# Patient Record
Sex: Female | Born: 1935 | Race: White | Hispanic: No | Marital: Married | Smoking: Never smoker
Health system: Southern US, Community
[De-identification: ages and names within clinical notes are randomized; demographics above are authoritative.]

## PROBLEM LIST (undated history)

## (undated) DIAGNOSIS — M5126 Other intervertebral disc displacement, lumbar region: Secondary | ICD-10-CM

## (undated) DIAGNOSIS — E559 Vitamin D deficiency, unspecified: Secondary | ICD-10-CM

## (undated) DIAGNOSIS — Z780 Asymptomatic menopausal state: Secondary | ICD-10-CM

## (undated) DIAGNOSIS — K5790 Diverticulosis of intestine, part unspecified, without perforation or abscess without bleeding: Secondary | ICD-10-CM

## (undated) DIAGNOSIS — N390 Urinary tract infection, site not specified: Secondary | ICD-10-CM

## (undated) DIAGNOSIS — M1990 Unspecified osteoarthritis, unspecified site: Secondary | ICD-10-CM

## (undated) DIAGNOSIS — N3941 Urge incontinence: Secondary | ICD-10-CM

## (undated) DIAGNOSIS — I10 Essential (primary) hypertension: Secondary | ICD-10-CM

## (undated) DIAGNOSIS — C4490 Unspecified malignant neoplasm of skin, unspecified: Secondary | ICD-10-CM

## (undated) DIAGNOSIS — M810 Age-related osteoporosis without current pathological fracture: Secondary | ICD-10-CM

## (undated) DIAGNOSIS — K219 Gastro-esophageal reflux disease without esophagitis: Secondary | ICD-10-CM

## (undated) DIAGNOSIS — E785 Hyperlipidemia, unspecified: Secondary | ICD-10-CM

## (undated) HISTORY — DX: Unspecified osteoarthritis, unspecified site: M19.90

## (undated) HISTORY — DX: Asymptomatic menopausal state: Z78.0

## (undated) HISTORY — PX: CATARACT EXTRACTION: SUR2

## (undated) HISTORY — DX: Hyperlipidemia, unspecified: E78.5

## (undated) HISTORY — DX: Other intervertebral disc displacement, lumbar region: M51.26

## (undated) HISTORY — DX: Diverticulosis of intestine, part unspecified, without perforation or abscess without bleeding: K57.90

## (undated) HISTORY — DX: Urge incontinence: N39.41

## (undated) HISTORY — DX: Unspecified malignant neoplasm of skin, unspecified: C44.90

---
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
## 1969-03-27 HISTORY — PX: INGUINAL HERNIA REPAIR: SUR1180

## 1983-03-28 HISTORY — PX: ABDOMINAL HYSTERECTOMY: SHX81

## 2003-03-28 HISTORY — PX: FIXATION KYPHOPLASTY LUMBAR SPINE: SHX1642

## 2005-04-13 LAB — HM COLONOSCOPY: HM Colonoscopy: NORMAL

## 2009-04-13 LAB — HM DEXA SCAN: HM Dexa Scan: NORMAL

## 2010-12-22 ENCOUNTER — Encounter: Payer: Self-pay | Admitting: Internal Medicine

## 2010-12-22 ENCOUNTER — Ambulatory Visit (INDEPENDENT_AMBULATORY_CARE_PROVIDER_SITE_OTHER): Payer: Medicare Other | Admitting: Internal Medicine

## 2010-12-22 ENCOUNTER — Ambulatory Visit (HOSPITAL_BASED_OUTPATIENT_CLINIC_OR_DEPARTMENT_OTHER)
Admission: RE | Admit: 2010-12-22 | Discharge: 2010-12-22 | Disposition: A | Discharge status: Home / Self Care | Payer: Medicare Other | Source: Ambulatory Visit | Attending: Internal Medicine | Admitting: Internal Medicine

## 2010-12-22 VITALS — BP 110/68 | HR 69 | Temp 97.1°F | Resp 20 | Ht 65.5 in | Wt 164.0 lb

## 2010-12-22 DIAGNOSIS — M1990 Unspecified osteoarthritis, unspecified site: Secondary | ICD-10-CM | POA: Insufficient documentation

## 2010-12-22 DIAGNOSIS — K5730 Diverticulosis of large intestine without perforation or abscess without bleeding: Secondary | ICD-10-CM

## 2010-12-22 DIAGNOSIS — N3941 Urge incontinence: Secondary | ICD-10-CM | POA: Insufficient documentation

## 2010-12-22 DIAGNOSIS — M5126 Other intervertebral disc displacement, lumbar region: Secondary | ICD-10-CM | POA: Insufficient documentation

## 2010-12-22 DIAGNOSIS — E785 Hyperlipidemia, unspecified: Secondary | ICD-10-CM

## 2010-12-22 DIAGNOSIS — K5790 Diverticulosis of intestine, part unspecified, without perforation or abscess without bleeding: Secondary | ICD-10-CM

## 2010-12-22 DIAGNOSIS — L989 Disorder of the skin and subcutaneous tissue, unspecified: Secondary | ICD-10-CM

## 2010-12-22 DIAGNOSIS — Z139 Encounter for screening, unspecified: Secondary | ICD-10-CM

## 2010-12-22 DIAGNOSIS — M7730 Calcaneal spur, unspecified foot: Secondary | ICD-10-CM

## 2010-12-22 DIAGNOSIS — Z780 Asymptomatic menopausal state: Secondary | ICD-10-CM | POA: Insufficient documentation

## 2010-12-22 DIAGNOSIS — Z1231 Encounter for screening mammogram for malignant neoplasm of breast: Secondary | ICD-10-CM | POA: Insufficient documentation

## 2010-12-22 DIAGNOSIS — R32 Unspecified urinary incontinence: Secondary | ICD-10-CM

## 2010-12-22 DIAGNOSIS — C4490 Unspecified malignant neoplasm of skin, unspecified: Secondary | ICD-10-CM | POA: Insufficient documentation

## 2010-12-22 DIAGNOSIS — Z85828 Personal history of other malignant neoplasm of skin: Secondary | ICD-10-CM

## 2010-12-22 LAB — TSH: TSH: 2.335 u[IU]/mL (ref 0.350–4.500)

## 2010-12-22 LAB — COMPREHENSIVE METABOLIC PANEL
Albumin: 4.1 g/dL (ref 3.5–5.2)
BUN: 22 mg/dL (ref 6–23)
CO2: 28 mEq/L (ref 19–32)
Calcium: 9.9 mg/dL (ref 8.4–10.5)
Chloride: 105 mEq/L (ref 96–112)
Glucose, Bld: 82 mg/dL (ref 70–99)
Potassium: 4.5 mEq/L (ref 3.5–5.3)
Total Protein: 6.9 g/dL (ref 6.0–8.3)

## 2010-12-22 LAB — CBC WITH DIFFERENTIAL/PLATELET
Basophils Relative: 1 % (ref 0–1)
HCT: 42.6 % (ref 36.0–46.0)
Hemoglobin: 13.6 g/dL (ref 12.0–15.0)
Lymphocytes Relative: 27 % (ref 12–46)
Lymphs Abs: 1.7 10*3/uL (ref 0.7–4.0)
MCHC: 31.9 g/dL (ref 30.0–36.0)
Monocytes Absolute: 0.5 10*3/uL (ref 0.1–1.0)
Monocytes Relative: 7 % (ref 3–12)
Neutro Abs: 3.7 10*3/uL (ref 1.7–7.7)
RBC: 4.59 MIL/uL (ref 3.87–5.11)

## 2010-12-22 LAB — LIPID PANEL: LDL Cholesterol: 148 mg/dL — ABNORMAL HIGH (ref 0–99)

## 2010-12-22 NOTE — Progress Notes (Signed)
Subjective:    Patient ID: Brianna Gregory, female    DOB: June 11, 1926, 77 y.o.   MRN: [id number]  HPI New pt here for first visit.  Resides in [place] with husband  Daughter Royal Piedra [phone].  Rolland Bimler [phone].  PMH of DJD, post-menopause, skin CA (believes it may be melanoma), urinary incontinence, Hyperlipidemia, diverticulosis heel spur, and chronic back pain secondary to HNP, DJD, and trauma from fall on black ice in 2005.  She is S/P baloon kyphoplasty.  She uses a TENS unit as needed, uses Celebrex and Tramadol prn and when in severe pain, prednisone is helpful.  She is active and does water aerobics 3 times per week.  Has also had steroid injections in lumbar area.   She is concerned over two issues.   She has not had her cholesterol checked in quite some time and it has been slightly elevated in the past.  She also has several scaly lesions on nose and neck that she would like to see a dermatologist about as she has had skin cancer in the past.  [place]   When asked about her heel spur she states that "we don't need to deal with that as yet."  Last colonoscopy 2007 no polyps per her report  No Known Allergies Past Medical History  Diagnosis Date  . Menopause   . Lumbar herniated disc   . Arthritis   . Skin cancer   . Glaucoma   . Urge incontinence   . Diverticulosis     colonoscopy 2007   Past Surgical History  Procedure Date  . Abdominal hysterectomy   . Fixation kyphoplasty lumbar spine 2005  . Hernia repair   . Appendectomy   . Cataract extraction    History   Social History  . Marital Status: Married    Spouse Name: N/A    Number of Children: N/A  . Years of Education: N/A   Occupational History  . Not on file.   Social History Main Topics  . Smoking status: Never Smoker   . Smokeless tobacco: Never Used  . Alcohol Use: 1.2 oz/week    2 Glasses of wine per week  . Drug  Use: No  . Sexually Active: Not Currently   Other Topics Concern  . Not on file   Social History Narrative  . No narrative on file   Family History  Problem Relation Age of Onset  . Heart failure Mother   . Diabetes Mother   . Heart failure Father   . Diabetes Father    Patient Active Problem List  Diagnoses  . Arthritis  . Skin cancer  . Menopause  . Glaucoma  . Lumbar herniated disc  . Urge incontinence  . Diverticulosis  . Heel spur   No current outpatient prescriptions on file prior to visit.        Review of Systems    No chest pain, no Sob, No LE edema.  No change in color of stool Objective:   Physical Exam Physical Exam  Nursing note and vitals reviewed.  Constitutional: She is oriented to person, place, and time. She appears well-developed and well-nourished.  HENT:  Head: Normocephalic and atraumatic.  Cardiovascular: Normal rate and regular rhythm. Exam reveals no gallop and no friction rub.  No murmur heard.  Pulmonary/Chest: Breath sounds normal. She has no wheezes. She has no rales.  Neurological: She is alert and oriented  to person, place, and time.  Skin: Skin is warm and dry. She has one skin tag on her neck and a few scaly lesions on nose and neck Psychiatric: She has a normal mood and affect. Her behavior is normal.      Assessment & Plan:  1)  Hyperlipidemia :  Will check lipids today with chenistries, CBC, and TSH 2)  Skin lesion h/o skin CA  Will refer to Dr. Emily Filbert 3)  Chronic back discomfort,  DJD on Celebrex and prn Tramadol 4) Urge incontinence 5) Diverticulosis 6)  Heel spur  Will address next visit 7)  Glaucoma  Will schedule CPE  Get mammogram today

## 2010-12-22 NOTE — Patient Instructions (Signed)
Schedule Complete physical exam with me with me  Will mail labs to you  We will set up referral to Dermatology  Mammogram today

## 2010-12-23 NOTE — Progress Notes (Signed)
Pt is scheduled for appointment with Dr. Emily Filbert 01/18/11 @ 210pm.  She is aware of appointment per K. Harvell-dhp, rn

## 2010-12-26 ENCOUNTER — Encounter: Payer: Self-pay | Admitting: Emergency Medicine

## 2011-01-12 ENCOUNTER — Encounter: Payer: Self-pay | Admitting: Internal Medicine

## 2011-01-12 ENCOUNTER — Ambulatory Visit (INDEPENDENT_AMBULATORY_CARE_PROVIDER_SITE_OTHER): Payer: Medicare Other | Admitting: Internal Medicine

## 2011-01-12 VITALS — BP 130/86 | HR 76 | Temp 98.5°F | Resp 16 | Ht 65.5 in | Wt 163.0 lb

## 2011-01-12 DIAGNOSIS — M129 Arthropathy, unspecified: Secondary | ICD-10-CM

## 2011-01-12 DIAGNOSIS — N3941 Urge incontinence: Secondary | ICD-10-CM

## 2011-01-12 DIAGNOSIS — C4490 Unspecified malignant neoplasm of skin, unspecified: Secondary | ICD-10-CM

## 2011-01-12 DIAGNOSIS — M1990 Unspecified osteoarthritis, unspecified site: Secondary | ICD-10-CM

## 2011-01-12 DIAGNOSIS — Z01419 Encounter for gynecological examination (general) (routine) without abnormal findings: Secondary | ICD-10-CM

## 2011-01-12 DIAGNOSIS — K621 Rectal polyp: Secondary | ICD-10-CM | POA: Insufficient documentation

## 2011-01-12 DIAGNOSIS — K5790 Diverticulosis of intestine, part unspecified, without perforation or abscess without bleeding: Secondary | ICD-10-CM

## 2011-01-12 DIAGNOSIS — K5730 Diverticulosis of large intestine without perforation or abscess without bleeding: Secondary | ICD-10-CM

## 2011-01-12 LAB — POCT URINALYSIS DIPSTICK
Ketones, UA: NEGATIVE
Leukocytes, UA: NEGATIVE
Nitrite, UA: NEGATIVE
Protein, UA: NEGATIVE
pH, UA: 7

## 2011-01-12 NOTE — Progress Notes (Addendum)
Subjective:    Patient ID: Brianna Gregory, female    DOB: 30-Sep-1926, 77 y.o.   MRN: [id number]  HPI  Sage is her for comprehensive evaluation.  She is doing well and will be getting her flu vaccine [facility].  She brings copy of colonoscopy report which showed rectal polyp done in 2007.  Arthritis  She has very little problem with her joints except R thumb will bother her.  She does knit .  She is quite active and does water aerobics.  Pt describes that she slipped in a step stool and fell hitting the back of her head 4 days ago.  No LOC.  Was seen by nurse at [facility].  No swelling of scalp now.  No pain in back or hips.  No dizziness, chest pain, or palpitaitons prior to fall  No Known Allergies Past Medical History  Diagnosis Date  . Menopause   . Lumbar herniated disc   . Arthritis   . Skin cancer   . Glaucoma   . Urge incontinence   . Diverticulosis     colonoscopy 2007   Past Surgical History  Procedure Date  . Abdominal hysterectomy   . Fixation kyphoplasty lumbar spine 2005  . Hernia repair   . Appendectomy   . Cataract extraction    History   Social History  . Marital Status: Married    Spouse Name: N/A    Number of Children: N/A  . Years of Education: N/A   Occupational History  . Not on file.   Social History Main Topics  . Smoking status: Never Smoker   . Smokeless tobacco: Never Used  . Alcohol Use: 1.2 oz/week    2 Glasses of wine per week  . Drug Use: No  . Sexually Active: Not Currently   Other Topics Concern  . Not on file   Social History Narrative  . No narrative on file   Family History  Problem Relation Age of Onset  . Heart failure Mother   . Diabetes Mother   . Heart failure Father   . Diabetes Father    Patient Active Problem List  Diagnoses  . Arthritis  . Skin cancer  . Menopause  . Glaucoma  . Lumbar herniated disc  . Urge incontinence  . Diverticulosis  . Heel spur  . Rectal polyp   Current  Outpatient Prescriptions on File Prior to Visit  Medication Sig Dispense Refill  . Carboxymethylcell-Hypromellose (GENTEAL) 0.25-0.3 % GEL Apply to eye at bedtime.        . celecoxib (CELEBREX) 200 MG capsule Take 200 mg by mouth 2 (two) times daily.        . Hypromellose (GENTEAL) 0.3 % SOLN Apply 1 drop to eye 4 (four) times daily.        . Multiple Vitamins-Minerals (CENTRUM SILVER PO) Take 1 tablet by mouth daily.        . Omega-3 Fatty Acids (FISH OIL) 1200 MG CAPS Take 1 capsule by mouth daily.        . polyethylene glycol powder (GLYCOLAX/MIRALAX) powder Take 17 g by mouth daily as needed.        . timolol (BETIMOL) 0.5 % ophthalmic solution Place 1 drop into both eyes daily.        . traMADol (ULTRAM) 50 MG tablet Take 50 mg by mouth every 8 (eight) hours as needed.               Review of Systems  No chest pain no sob no Le edema No headached no visual changes No change in color of stool    Objective:   Physical Exam Physical Exam  Nursing note and vitals reviewed.  Constitutional: She is oriented to person, place, and time. She appears well-developed and well-nourished.  HENT:  Head: Normocephalic and atraumatic.  Right Ear: Tympanic membrane and ear canal normal. No drainage. Tympanic membrane is not injected and not erythematous.  Left Ear: Tympanic membrane and ear canal normal. No drainage. Tympanic membrane is not injected and not erythematous.  Nose: Nose normal. Right sinus exhibits no maxillary sinus tenderness and no frontal sinus tenderness. Left sinus exhibits no maxillary sinus tenderness and no frontal sinus tenderness.  Mouth/Throat: Oropharynx is clear and moist. No oral lesions. No oropharyngeal exudate.  Eyes: Conjunctivae and EOM are normal. Pupils are equal, round, and reactive to light.  Neck: Normal range of motion. Neck supple. No JVD present. Carotid bruit is not present. No mass and no thyromegaly present.  Cardiovascular: Normal rate, regular  rhythm, S1 normal, S2 normal and intact distal pulses. Exam reveals no gallop and no friction rub.  No murmur heard.  Pulses:  Carotid pulses are 2+ on the right side, and 2+ on the left side.  Dorsalis pedis pulses are 2+ on the right side, and 2+ on the left side.  No carotid bruit. No LE edema  Pulmonary/Chest: Breath sounds normal. She has no wheezes. She has no rales. She exhibits no tenderness. Breasts no discrete masses no nipple discharge no axillary adenopathy bilaterally. Abdominal: Soft. Bowel sounds are normal. She exhibits no distension and no mass. There is no hepatosplenomegaly. There is no tenderness. There is no CVA tenderness.  Musculoskeletal: Normal range of motion.  No active synovitis to joints.  Lymphadenopathy:  She has no cervical adenopathy.  She has no axillary adenopathy.  Right: No inguinal and no supraclavicular adenopathy present.  Left: No inguinal and no supraclavicular adenopathy present.  Neurological: She is alert and oriented to person, place, and time. She has normal strength and normal reflexes. She displays no tremor. No cranial nerve deficit or sensory deficit. Coordination and gait normal.  Skin: Skin is warm and dry. No rash noted. No cyanosis. Nails show no clubbing.  Psychiatric: She has a normal mood and affect. Her speech is normal and behavior is normal. Cognition and memory are normal.           Assessment & Plan:  1)  Rectal polyp  Will set up referral to dr. Lina Sar.  Pt requests referral to be in 2013 2) DJD  On Celebrex 3)_ Diverticulosis 4)  Urge incontinence  Does to wish any meds at this point 5)  Fall 4 days ago.  Seem to be resolving nicely  I spent 45 minutes with the pt  Addendum:  Head CT verbal preliminary report 02/01/13  No acute intracranial process,  Atrophy and microvascular chronic ischemic changes

## 2011-01-12 NOTE — Patient Instructions (Signed)
Will set up referral to Dr. Juanda Chance  Return prn

## 2011-01-16 ENCOUNTER — Telehealth: Payer: Self-pay | Admitting: Internal Medicine

## 2011-01-16 NOTE — Telephone Encounter (Signed)
Per Selena Batten on 01/16/2011: Pt called in on Friday and stated she misplaced her diet and copy of labs and would like them mail out to her. Thanks

## 2011-01-17 NOTE — Telephone Encounter (Signed)
Copy of letter and DASH diet mailed to pt's home address

## 2011-01-23 ENCOUNTER — Ambulatory Visit (INDEPENDENT_AMBULATORY_CARE_PROVIDER_SITE_OTHER): Payer: Medicare Other | Admitting: Emergency Medicine

## 2011-01-23 VITALS — BP 148/84 | HR 70

## 2011-01-23 DIAGNOSIS — Z23 Encounter for immunization: Secondary | ICD-10-CM

## 2011-01-23 MED ORDER — TETANUS-DIPHTH-ACELL PERTUSSIS 5-2.5-18.5 LF-MCG/0.5 IM SUSP: 0.5000 mL | Freq: Once | INTRAMUSCULAR | Status: DC

## 2011-02-01 ENCOUNTER — Encounter: Payer: Self-pay | Admitting: Internal Medicine

## 2011-02-01 ENCOUNTER — Ambulatory Visit (INDEPENDENT_AMBULATORY_CARE_PROVIDER_SITE_OTHER): Payer: Medicare Other | Admitting: Internal Medicine

## 2011-02-01 DIAGNOSIS — R51 Headache: Secondary | ICD-10-CM

## 2011-02-01 DIAGNOSIS — S0990XA Unspecified injury of head, initial encounter: Secondary | ICD-10-CM

## 2011-02-01 DIAGNOSIS — R519 Headache, unspecified: Secondary | ICD-10-CM | POA: Insufficient documentation

## 2011-02-01 NOTE — Progress Notes (Signed)
Subjective:    Patient ID: Brianna Gregory, female    DOB: 1926/10/23, 77 y.o.   MRN: [id number]  HPI  Camille comes with an acute concern.  She has been having daily early am headaches for the past 2 weeks.  She is concerned because she fell and hit the back of her head on a table on 10/14.  She was checked by the staff at [facility] and they felt she was OK at the time  .  Pt reported on written document that "I feel fine".  She reports she did not lose consiousness.  She has no current visual changes no blurring no diplopia, no dysarthria, no muscle weakness or numbness in face or extremities.  She had recent eye exam and vision was reported as OK.  She takes Tramadol  Daily for joint pain and reports the Tramadol will relieve the pain.  She only has pain on awakening  Located in occipital areas  Allergies  Allergen Reactions  . Statins Other (See Comments)    myalgias   Past Medical History  Diagnosis Date  . Menopause   . Lumbar herniated disc   . Arthritis   . Skin cancer   . Glaucoma   . Urge incontinence   . Diverticulosis     colonoscopy 2007   Past Surgical History  Procedure Date  . Abdominal hysterectomy   . Fixation kyphoplasty lumbar spine 2005  . Hernia repair   . Appendectomy   . Cataract extraction    History   Social History  . Marital Status: Married    Spouse Name: N/A    Number of Children: N/A  . Years of Education: N/A   Occupational History  . Not on file.   Social History Main Topics  . Smoking status: Never Smoker   . Smokeless tobacco: Never Used  . Alcohol Use: 1.2 oz/week    2 Glasses of wine per week  . Drug Use: No  . Sexually Active: Not Currently   Other Topics Concern  . Not on file   Social History Narrative  . No narrative on file   Family History  Problem Relation Age of Onset  . Heart failure Mother   . Diabetes Mother   . Heart failure Father   . Diabetes Father    Patient Active Problem List  Diagnoses  .  Arthritis  . Skin cancer  . Menopause  . Glaucoma  . Lumbar herniated disc  . Urge incontinence  . Diverticulosis  . Heel spur  . Rectal polyp  . Headache   Current Outpatient Prescriptions on File Prior to Visit  Medication Sig Dispense Refill  . Carboxymethylcell-Hypromellose (GENTEAL) 0.25-0.3 % GEL Apply to eye at bedtime.        . celecoxib (CELEBREX) 200 MG capsule Take 200 mg by mouth 2 (two) times daily.        . Hypromellose (GENTEAL) 0.3 % SOLN Apply 1 drop to eye 4 (four) times daily.        . Multiple Vitamins-Minerals (CENTRUM SILVER PO) Take 1 tablet by mouth daily.        . Omega-3 Fatty Acids (FISH OIL) 1200 MG CAPS Take 1 capsule by mouth daily.        . polyethylene glycol powder (GLYCOLAX/MIRALAX) powder Take 17 g by mouth daily as needed.        . timolol (BETIMOL) 0.5 % ophthalmic solution Place 1 drop into both eyes daily.        Marland Kitchen  traMADol (ULTRAM) 50 MG tablet Take 50 mg by mouth every 8 (eight) hours as needed.         Current Facility-Administered Medications on File Prior to Visit  Medication Dose Route Frequency Provider Last Rate Last Dose  . DISCONTD: TDaP (BOOSTRIX) injection 0.5 mL  0.5 mL Intramuscular Once Levon Hedger, MD            Review of Systems    see HPI Objective:   Physical Exam Physical Exam  Nursing note and vitals reviewed.  Constitutional: She is oriented to person, place, and time. She appears well-developed and well-nourished.  HENT: Scalp  No nodules or ecchymosis Head: Normocephalic and atraumatic.  Perla  Fundi:  Unable to visualize due to pupil size.  No racoon's eyes.  Ears:  No hemotympanum Cardiovascular: Normal rate and regular rhythm. Exam reveals no gallop and no friction rub.  No murmur heard.  Pulmonary/Chest: Breath sounds normal. She has no wheezes. She has no rales.  Neurological: She is alert and oriented to person, place, and time.  Skin: Skin is warm and dry.  Psychiatric: She has a normal mood and  affect. Her behavior is normal.  Neurologic:  CN II-XII intact.  Cerebellar  Intact FTN  Motor 5/5 UE and LE  Sensory intact to pp.  No focal deficits        Assessment & Plan:  1)  Headache in setting of head injury 3 weeks ago:  Will get Head CT with and w/o.  Needs stat CBC and chemistry today.  OK to use Tramadol for now

## 2011-02-01 NOTE — Patient Instructions (Signed)
To Xray in am after bun/creatinine results known  Ok to take Tramadol for pain

## 2011-02-02 ENCOUNTER — Ambulatory Visit (HOSPITAL_BASED_OUTPATIENT_CLINIC_OR_DEPARTMENT_OTHER)
Admission: RE | Admit: 2011-02-02 | Discharge: 2011-02-02 | Disposition: A | Discharge status: Home / Self Care | Payer: Medicare Other | Source: Ambulatory Visit | Attending: Internal Medicine | Admitting: Internal Medicine

## 2011-02-02 DIAGNOSIS — R51 Headache: Secondary | ICD-10-CM | POA: Insufficient documentation

## 2011-02-02 DIAGNOSIS — S0990XA Unspecified injury of head, initial encounter: Secondary | ICD-10-CM

## 2011-02-02 DIAGNOSIS — X58XXXA Exposure to other specified factors, initial encounter: Secondary | ICD-10-CM

## 2011-02-02 DIAGNOSIS — G319 Degenerative disease of nervous system, unspecified: Secondary | ICD-10-CM

## 2011-02-02 LAB — COMPREHENSIVE METABOLIC PANEL
AST: 21 U/L (ref 0–37)
Alkaline Phosphatase: 67 U/L (ref 39–117)
BUN: 23 mg/dL (ref 6–23)
Creat: 0.88 mg/dL (ref 0.50–1.10)
Potassium: 4.4 mEq/L (ref 3.5–5.3)

## 2011-02-02 LAB — CBC WITH DIFFERENTIAL/PLATELET
Basophils Absolute: 0 10*3/uL (ref 0.0–0.1)
Basophils Relative: 1 % (ref 0–1)
Eosinophils Relative: 5 % (ref 0–5)
HCT: 38.3 % (ref 36.0–46.0)
MCHC: 31.9 g/dL (ref 30.0–36.0)
Monocytes Absolute: 0.4 10*3/uL (ref 0.1–1.0)
Neutro Abs: 3.8 10*3/uL (ref 1.7–7.7)
Platelets: 219 10*3/uL (ref 150–400)
RDW: 14.3 % (ref 11.5–15.5)

## 2011-02-02 MED ORDER — IOHEXOL 300 MG/ML  SOLN
80.0000 mL | Freq: Once | INTRAMUSCULAR | Status: AC | PRN
  Administered 2011-02-02: 80 mL via INTRAVENOUS

## 2011-02-03 ENCOUNTER — Encounter: Payer: Self-pay | Admitting: Emergency Medicine

## 2011-02-15 ENCOUNTER — Encounter: Payer: Self-pay | Admitting: Internal Medicine

## 2011-03-10 ENCOUNTER — Encounter: Payer: Self-pay | Admitting: Internal Medicine

## 2011-03-10 ENCOUNTER — Telehealth: Payer: Self-pay | Admitting: Emergency Medicine

## 2011-03-10 NOTE — Telephone Encounter (Signed)
Spoke with Brianna Gregory.  She states that she is having skin level stinging on her lower back "at L1" where she had a "balloon procedure" years ago.  She states that the stinging has been going on for a few weeks now.  She noticed a knot last night when she was applying lotion.  This morning, the knot has gone away.  She denies any frank back pain, radicular symptoms.  She did have a fall in Oct (see OV note from Nov) without injury, but does remember falling on a step stool with one of the bars across her back where the stinging is now.  I scheduled an appt with Dr. Adrian Blackwater on Tuesday, advised her if symptoms worsen to seek care at urgent care or ED.  She is agreeable

## 2011-03-14 ENCOUNTER — Ambulatory Visit (INDEPENDENT_AMBULATORY_CARE_PROVIDER_SITE_OTHER): Payer: Medicare Other | Admitting: Internal Medicine

## 2011-03-14 ENCOUNTER — Encounter: Payer: Self-pay | Admitting: Internal Medicine

## 2011-03-14 DIAGNOSIS — Z8781 Personal history of (healed) traumatic fracture: Secondary | ICD-10-CM

## 2011-03-14 DIAGNOSIS — M549 Dorsalgia, unspecified: Secondary | ICD-10-CM

## 2011-03-14 NOTE — Patient Instructions (Signed)
Back Exercises Back exercises help treat and prevent back injuries. The goal of back exercises is to increase the strength of your abdominal and back muscles and the flexibility of your back. These exercises should be started when you no longer have back pain. Back exercises include:  Pelvic Tilt. Lie on your back with your knees bent. Tilt your pelvis until the lower part of your back is against the floor. Hold this position 5 to 10 sec and repeat 5 to 10 times.   Knee to Chest. Pull first 1 knee up against your chest and hold for 20 to 30 seconds, repeat this with the other knee, and then both knees. This may be done with the other leg straight or bent, whichever feels better.   Hip-Lift. Lie on your back with your knees flexed 90 degrees. Push down with your feet and shoulders as you raise your hips a couple inches off the floor; hold for 10 seconds, repeat 5 to 10 times.   Shoulder-Lifts. Lie face down with arms beside your body. Keep hips and torso pressed to floor as you slowly lift your head and shoulders off the floor.  Do not overdo your exercises, especially in the beginning. Exercises may cause you some mild back discomfort which lasts for a few minutes; however, if the pain is more severe, or lasts for more than 15 minutes, do not continue exercises until you see your caregiver. Improvement with exercise therapy for back problems is slow.  See your caregivers for assistance with developing a proper back exercise program. Document Released: 04/20/2004 Document Revised: 11/09/2010 Document Reviewed: 03/13/2005 [facility] Patient Information 2012 [place], [place].

## 2011-03-14 NOTE — Progress Notes (Signed)
  Subjective:    Patient ID: Brianna Gregory, female    DOB: 1927-01-26, 77 y.o.   MRN: [id number]  HPI Patient seen for nonradiating stinging lumbar back pain that started 1 week ago.  The pain has improved and is gone today.  She has a history of a compression fracture of L1 and is status post balloon kyphoplasty in 2005.  She has a fall in the middle of October, but has had no back pain until recently.  Has been resting since felt the pain.   Review of Systems  Constitutional: Negative for fever, chills and fatigue.  HENT: Negative for neck pain.   Gastrointestinal: Negative for nausea, vomiting and diarrhea.  Musculoskeletal: Negative for myalgias, arthralgias and gait problem.  Neurological: Negative for dizziness, weakness, numbness and headaches.       Objective:   Physical Exam  Constitutional: She is oriented to person, place, and time. She appears well-developed and well-nourished.  Musculoskeletal:       No spinal process tenderness.  Neurological: She is alert and oriented to person, place, and time. She has normal reflexes.       Strength 5/5 in lower extremities bilaterally.  Skin:       No rash.      Assessment & Plan:  1.  Back Pain Musculoskeletal in origin.  No evidence of worsening compression fracture, disc herniation or shingles.  Back exercises given.  Continue with water aerobics.  Follow up prn.

## 2011-04-27 ENCOUNTER — Ambulatory Visit: Payer: Medicare Other | Admitting: Internal Medicine

## 2011-04-27 ENCOUNTER — Ambulatory Visit (INDEPENDENT_AMBULATORY_CARE_PROVIDER_SITE_OTHER): Payer: Medicare Other | Admitting: Internal Medicine

## 2011-04-27 ENCOUNTER — Encounter: Payer: Self-pay | Admitting: Internal Medicine

## 2011-04-27 DIAGNOSIS — IMO0002 Reserved for concepts with insufficient information to code with codable children: Secondary | ICD-10-CM

## 2011-04-27 DIAGNOSIS — M549 Dorsalgia, unspecified: Secondary | ICD-10-CM

## 2011-04-27 DIAGNOSIS — R51 Headache: Secondary | ICD-10-CM

## 2011-04-27 LAB — BASIC METABOLIC PANEL
CO2: 25 mEq/L (ref 19–32)
Calcium: 9.7 mg/dL (ref 8.4–10.5)
Glucose, Bld: 94 mg/dL (ref 70–99)
Sodium: 142 mEq/L (ref 135–145)

## 2011-04-27 NOTE — Progress Notes (Signed)
Subjective:    Patient ID: Brianna Gregory, female    DOB: 1926-11-28, 78 y.o.   MRN: [id number]  HPI  Brianna Gregory is here with a concern over persistant headaches since her fall in October.  She has daily morning pain in R side of parietal occipital area relieved by Tramadol .  She usually uses Tramadol for her joint pains.  She denies visual, speech changes,  No numbness and not motor weakness.  Sh e has been taking morning Tramadol since October.   She also hs low back discomfort and has had a compression fracture in the past, treated with great improvement with kyphoplasty done in [place].  She still has discomfort in the L/S area no radiation down sciatic nerve distributiin  No Le enumbness  Allergies  Allergen Reactions  . Statins Other (See Comments)    myalgias   Past Medical History  Diagnosis Date  . Menopause   . Lumbar herniated disc   . Arthritis   . Skin cancer   . Glaucoma   . Urge incontinence   . Diverticulosis     colonoscopy 2007   Past Surgical History  Procedure Date  . Abdominal hysterectomy   . Fixation kyphoplasty lumbar spine 2005  . Hernia repair   . Appendectomy   . Cataract extraction    History   Social History  . Marital Status: Married    Spouse Name: N/A    Number of Children: N/A  . Years of Education: N/A   Occupational History  . Not on file.   Social History Main Topics  . Smoking status: Never Smoker   . Smokeless tobacco: Never Used  . Alcohol Use: 1.2 oz/week    2 Glasses of wine per week  . Drug Use: No  . Sexually Active: Not Currently   Other Topics Concern  . Not on file   Social History Narrative  . No narrative on file   Family History  Problem Relation Age of Onset  . Heart failure Mother   . Diabetes Mother   . Heart failure Father   . Diabetes Father    Patient Active Problem List  Diagnoses  . Arthritis  . Skin cancer  . Menopause  . Glaucoma  . Lumbar herniated disc  . Urge incontinence    . Diverticulosis  . Heel spur  . Rectal polyp  . Headache  . Back pain  . History of compression fracture of spine   Current Outpatient Prescriptions on File Prior to Visit  Medication Sig Dispense Refill  . Carboxymethylcell-Hypromellose (GENTEAL) 0.25-0.3 % GEL Apply to eye at bedtime.        . celecoxib (CELEBREX) 200 MG capsule Take 200 mg by mouth 2 (two) times daily.        . Hypromellose (GENTEAL) 0.3 % SOLN Apply 1 drop to eye 4 (four) times daily.        . Multiple Vitamins-Minerals (CENTRUM SILVER PO) Take 1 tablet by mouth daily.        . Omega-3 Fatty Acids (FISH OIL) 1200 MG CAPS Take 1 capsule by mouth daily.        . polyethylene glycol powder (GLYCOLAX/MIRALAX) powder Take 17 g by mouth daily as needed.        . timolol (BETIMOL) 0.5 % ophthalmic solution Place 1 drop into both eyes daily.        . traMADol (ULTRAM) 50 MG tablet Take 50 mg by mouth every 8 (eight) hours as needed.  Review of Systems See HPI    Objective:   Physical Exam  Physical Exam  Nursing note and vitals reviewed.  Constitutional: She is oriented to person, place, and time. She appears well-developed and well-nourished.  HENT:  Head: Normocephalic and atraumatic.  Cardiovascular: Normal rate and regular rhythm. Exam reveals no gallop and no friction rub.  No murmur heard.  Pulmonary/Chest: Breath sounds normal. She has no wheezes. She has no rales.  Neurological: She is alert and oriented to person, place, and time.  Skin: Skin is warm and dry.  Psychiatric: She has a normal mood and affect. Her behavior is normal. Neruologic  CNII-XII intact.  Motor 5/5 Ue an dLE  Sensory intact to pinprick  Cerebellar intact  FTN. Grossly nonfocal         Assessment & Plan:  1)  Persistant headache associated with fall  Will get MRI with and without contrast.  May need neurology eval .  Depending on results 2)  Back pain with history of compression FX  Will get L/S spine films.

## 2011-04-27 NOTE — Patient Instructions (Signed)
To have brain MRI and back xrays    Will call with results

## 2011-05-02 ENCOUNTER — Ambulatory Visit (HOSPITAL_BASED_OUTPATIENT_CLINIC_OR_DEPARTMENT_OTHER)
Admission: RE | Admit: 2011-05-02 | Discharge: 2011-05-02 | Disposition: A | Discharge status: Home / Self Care | Payer: Medicare Other | Source: Ambulatory Visit | Attending: Internal Medicine | Admitting: Internal Medicine

## 2011-05-02 ENCOUNTER — Ambulatory Visit (INDEPENDENT_AMBULATORY_CARE_PROVIDER_SITE_OTHER)
Admission: RE | Admit: 2011-05-02 | Discharge: 2011-05-02 | Disposition: A | Discharge status: Home / Self Care | Payer: Medicare Other | Source: Ambulatory Visit | Attending: Internal Medicine | Admitting: Internal Medicine

## 2011-05-02 DIAGNOSIS — IMO0002 Reserved for concepts with insufficient information to code with codable children: Secondary | ICD-10-CM

## 2011-05-02 DIAGNOSIS — M4120 Other idiopathic scoliosis, site unspecified: Secondary | ICD-10-CM

## 2011-05-02 DIAGNOSIS — M479 Spondylosis, unspecified: Secondary | ICD-10-CM

## 2011-05-02 DIAGNOSIS — G319 Degenerative disease of nervous system, unspecified: Secondary | ICD-10-CM | POA: Insufficient documentation

## 2011-05-02 DIAGNOSIS — M549 Dorsalgia, unspecified: Secondary | ICD-10-CM

## 2011-05-02 DIAGNOSIS — J3489 Other specified disorders of nose and nasal sinuses: Secondary | ICD-10-CM | POA: Insufficient documentation

## 2011-05-02 DIAGNOSIS — R51 Headache: Secondary | ICD-10-CM | POA: Insufficient documentation

## 2011-05-02 DIAGNOSIS — I679 Cerebrovascular disease, unspecified: Secondary | ICD-10-CM | POA: Insufficient documentation

## 2011-05-02 MED ORDER — GADOBENATE DIMEGLUMINE 529 MG/ML IV SOLN
15.0000 mL | Freq: Once | INTRAVENOUS | Status: AC | PRN
  Administered 2011-05-02: 15 mL via INTRAVENOUS

## 2011-05-03 ENCOUNTER — Encounter: Payer: Self-pay | Admitting: Emergency Medicine

## 2011-05-05 ENCOUNTER — Telehealth: Payer: Self-pay | Admitting: Internal Medicine

## 2011-05-05 DIAGNOSIS — R51 Headache: Secondary | ICD-10-CM

## 2011-05-05 NOTE — Telephone Encounter (Signed)
Spoke with pt. On 2/7 and informed of sheer injury on MRI. Will refer to Neurology.  She has appt with Dr.  Vickey Huger on 2/8  Pt voices understanding

## 2011-05-25 ENCOUNTER — Ambulatory Visit (INDEPENDENT_AMBULATORY_CARE_PROVIDER_SITE_OTHER): Payer: Medicare Other | Admitting: Internal Medicine

## 2011-05-25 ENCOUNTER — Encounter: Payer: Self-pay | Admitting: Internal Medicine

## 2011-05-25 VITALS — BP 113/70 | HR 76 | Temp 97.2°F | Ht 67.0 in | Wt 167.0 lb

## 2011-05-25 DIAGNOSIS — M1990 Unspecified osteoarthritis, unspecified site: Secondary | ICD-10-CM

## 2011-05-25 DIAGNOSIS — R51 Headache: Secondary | ICD-10-CM

## 2011-05-25 MED ORDER — OMEPRAZOLE 20 MG PO CPDR: 20.0000 mg | DELAYED_RELEASE_CAPSULE | Freq: Every day | ORAL | Status: DC

## 2011-05-25 MED ORDER — LANSOPRAZOLE 30 MG PO CPDR: 30.0000 mg | DELAYED_RELEASE_CAPSULE | Freq: Every day | ORAL | Status: DC

## 2011-05-25 MED ORDER — TRAMADOL HCL 50 MG PO TABS: 50.0000 mg | ORAL_TABLET | Freq: Three times a day (TID) | ORAL | Status: DC | PRN

## 2011-05-25 MED ORDER — CELECOXIB 200 MG PO CAPS: 200.0000 mg | ORAL_CAPSULE | Freq: Two times a day (BID) | ORAL | Status: DC

## 2011-05-25 NOTE — Progress Notes (Signed)
Addended by: Chip Boer on: 05/25/2011 12:28 PM   Modules accepted: Orders

## 2011-05-25 NOTE — Progress Notes (Signed)
Subjective:    Patient ID: Brianna Gregory, female    DOB: 05/06/1926, 78 y.o.   MRN: [id number]  HPI  Brianna Gregory is here to follow up on her headaches.  Dr. Vickey Huger placed her on 30 days of Gabapentin 100 mg and she says this is helping.  She has less headaches.    She is using less Tramadol.   NO visual changes or muscle weakness   She alternates Tramadol and Celebrex for her joint arthralagias.   She denies dyspepsia or heartburn.  She is not on a PPI   Allergies  Allergen Reactions  . Statins Other (See Comments)    myalgias   Past Medical History  Diagnosis Date  . Menopause   . Lumbar herniated disc   . Arthritis   . Skin cancer   . Glaucoma   . Urge incontinence   . Diverticulosis     colonoscopy 2007   Past Surgical History  Procedure Date  . Abdominal hysterectomy   . Fixation kyphoplasty lumbar spine 2005  . Hernia repair   . Appendectomy   . Cataract extraction    History   Social History  . Marital Status: Married    Spouse Name: N/A    Number of Children: N/A  . Years of Education: N/A   Occupational History  . Not on file.   Social History Main Topics  . Smoking status: Never Smoker   . Smokeless tobacco: Never Used  . Alcohol Use: 1.2 oz/week    2 Glasses of wine per week  . Drug Use: No  . Sexually Active: Not Currently   Other Topics Concern  . Not on file   Social History Narrative  . No narrative on file   Family History  Problem Relation Age of Onset  . Heart failure Mother   . Diabetes Mother   . Heart failure Father   . Diabetes Father    Patient Active Problem List  Diagnoses  . Arthritis  . Skin cancer  . Menopause  . Glaucoma  . Lumbar herniated disc  . Urge incontinence  . Diverticulosis  . Heel spur  . Rectal polyp  . Headache  . Back pain  . History of compression fracture of spine   Current Outpatient Prescriptions on File Prior to Visit  Medication Sig Dispense Refill  . Carboxymethylcell-Hypromellose  (GENTEAL) 0.25-0.3 % GEL Apply to eye at bedtime.        . Hypromellose (GENTEAL) 0.3 % SOLN Apply 1 drop to eye 4 (four) times daily.        . Multiple Vitamins-Minerals (CENTRUM SILVER PO) Take 1 tablet by mouth daily.        . Omega-3 Fatty Acids (FISH OIL) 1200 MG CAPS Take 1 capsule by mouth daily.        . polyethylene glycol powder (GLYCOLAX/MIRALAX) powder Take 17 g by mouth daily as needed.        . timolol (BETIMOL) 0.5 % ophthalmic solution Place 1 drop into both eyes daily.        Marland Kitchen DISCONTD: celecoxib (CELEBREX) 200 MG capsule Take 200 mg by mouth 2 (two) times daily.        Marland Kitchen DISCONTD: traMADol (ULTRAM) 50 MG tablet Take 50 mg by mouth every 8 (eight) hours as needed.               Review of Systems See HPI    Objective:   Physical Exam  Physical Exam  Nursing note and  vitals reviewed.  Constitutional: She is oriented to person, place, and time. She appears well-developed and well-nourished.  HENT:  Head: Normocephalic and atraumatic.  Cardiovascular: Normal rate and regular rhythm. Exam reveals no gallop and no friction rub.  No murmur heard.  Pulmonary/Chest: Breath sounds normal. She has no wheezes. She has no rales.  Neurological: She is alert and oriented to person, place, and time.  Skin: Skin is warm and dry.  Psychiatric: She has a normal mood and affect. Her behavior is normal.        Assessment & Plan:  1)  Headache improved with gabapentin 2)  DJD  Will add  OTC PPI for gastroprotection.  Pt counseled if any dyspepsia, heartburn or dark stools to call office   She voices understanding

## 2011-05-25 NOTE — Patient Instructions (Signed)
Take over the counter prilosec or pepcid every day with your Celebrex  Return as needed

## 2011-07-05 ENCOUNTER — Other Ambulatory Visit: Payer: Self-pay | Admitting: Emergency Medicine

## 2011-07-05 MED ORDER — OMEPRAZOLE 20 MG PO CPDR: 20.0000 mg | DELAYED_RELEASE_CAPSULE | Freq: Every day | ORAL | Status: DC

## 2011-07-05 NOTE — Telephone Encounter (Signed)
Letter received in the mail today from pt regarding medication.  Lansoprazole requires step therapy in order to be covered.  It was sent to her and by her note, was very expensive.  Per note, she would like to go back to omeprazole.  Left message for further clarification.

## 2011-07-05 NOTE — Telephone Encounter (Signed)
Needs script to mail order pharmacy

## 2011-07-05 NOTE — Telephone Encounter (Signed)
Left message for pt, medication sent in for omeprazole to mail order pharmacy

## 2012-01-16 ENCOUNTER — Telehealth: Payer: Self-pay | Admitting: *Deleted

## 2012-01-16 NOTE — Telephone Encounter (Signed)
Rescheduled pt to Nov.4 at 945

## 2012-01-17 ENCOUNTER — Encounter: Payer: Medicare Other | Admitting: Internal Medicine

## 2012-01-29 ENCOUNTER — Ambulatory Visit (HOSPITAL_BASED_OUTPATIENT_CLINIC_OR_DEPARTMENT_OTHER)
Admission: RE | Admit: 2012-01-29 | Discharge: 2012-01-29 | Disposition: A | Discharge status: Home / Self Care | Payer: Medicare Other | Source: Ambulatory Visit | Attending: Internal Medicine | Admitting: Internal Medicine

## 2012-01-29 ENCOUNTER — Ambulatory Visit (INDEPENDENT_AMBULATORY_CARE_PROVIDER_SITE_OTHER): Payer: Medicare Other | Admitting: Internal Medicine

## 2012-01-29 ENCOUNTER — Encounter: Payer: Self-pay | Admitting: Gastroenterology

## 2012-01-29 ENCOUNTER — Encounter: Payer: Self-pay | Admitting: Internal Medicine

## 2012-01-29 VITALS — BP 128/88 | HR 70 | Temp 97.0°F | Resp 18 | Wt 170.0 lb

## 2012-01-29 DIAGNOSIS — K219 Gastro-esophageal reflux disease without esophagitis: Secondary | ICD-10-CM

## 2012-01-29 DIAGNOSIS — E785 Hyperlipidemia, unspecified: Secondary | ICD-10-CM

## 2012-01-29 DIAGNOSIS — Z1231 Encounter for screening mammogram for malignant neoplasm of breast: Secondary | ICD-10-CM | POA: Insufficient documentation

## 2012-01-29 DIAGNOSIS — N3941 Urge incontinence: Secondary | ICD-10-CM

## 2012-01-29 DIAGNOSIS — R209 Unspecified disturbances of skin sensation: Secondary | ICD-10-CM

## 2012-01-29 DIAGNOSIS — M810 Age-related osteoporosis without current pathological fracture: Secondary | ICD-10-CM

## 2012-01-29 DIAGNOSIS — Z139 Encounter for screening, unspecified: Secondary | ICD-10-CM

## 2012-01-29 DIAGNOSIS — K621 Rectal polyp: Secondary | ICD-10-CM

## 2012-01-29 DIAGNOSIS — Z780 Asymptomatic menopausal state: Secondary | ICD-10-CM

## 2012-01-29 DIAGNOSIS — C4490 Unspecified malignant neoplasm of skin, unspecified: Secondary | ICD-10-CM

## 2012-01-29 DIAGNOSIS — K5790 Diverticulosis of intestine, part unspecified, without perforation or abscess without bleeding: Secondary | ICD-10-CM

## 2012-01-29 DIAGNOSIS — K620 Anal polyp: Secondary | ICD-10-CM

## 2012-01-29 DIAGNOSIS — R200 Anesthesia of skin: Secondary | ICD-10-CM | POA: Insufficient documentation

## 2012-01-29 DIAGNOSIS — K5730 Diverticulosis of large intestine without perforation or abscess without bleeding: Secondary | ICD-10-CM

## 2012-01-29 DIAGNOSIS — Z8781 Personal history of (healed) traumatic fracture: Secondary | ICD-10-CM

## 2012-01-29 DIAGNOSIS — Z0000 Encounter for general adult medical examination without abnormal findings: Secondary | ICD-10-CM

## 2012-01-29 LAB — CBC WITH DIFFERENTIAL/PLATELET
Basophils Relative: 1 % (ref 0–1)
Eosinophils Absolute: 0.5 10*3/uL (ref 0.0–0.7)
Eosinophils Relative: 7 % — ABNORMAL HIGH (ref 0–5)
HCT: 36.4 % (ref 36.0–46.0)
Hemoglobin: 12.2 g/dL (ref 12.0–15.0)
MCH: 30.6 pg (ref 26.0–34.0)
MCHC: 33.5 g/dL (ref 30.0–36.0)
Monocytes Absolute: 0.6 10*3/uL (ref 0.1–1.0)
Monocytes Relative: 9 % (ref 3–12)

## 2012-01-29 LAB — COMPREHENSIVE METABOLIC PANEL
Alkaline Phosphatase: 73 U/L (ref 39–117)
BUN: 23 mg/dL (ref 6–23)
Glucose, Bld: 69 mg/dL — ABNORMAL LOW (ref 70–99)
Total Bilirubin: 0.6 mg/dL (ref 0.3–1.2)

## 2012-01-29 LAB — LIPID PANEL
HDL: 52 mg/dL (ref >39)
LDL Cholesterol: 152 mg/dL — ABNORMAL HIGH (ref 0–99)
Triglycerides: 122 mg/dL (ref <150)
VLDL: 24 mg/dL (ref 0–40)

## 2012-01-29 LAB — POCT URINALYSIS DIPSTICK
Bilirubin, UA: NEGATIVE
Blood, UA: NEGATIVE
Ketones, UA: NEGATIVE
Nitrite, UA: NEGATIVE
pH, UA: 6

## 2012-01-29 MED ORDER — POLYETHYLENE GLYCOL 3350 17 GM/SCOOP PO POWD: 17.0000 g | Freq: Every day | ORAL | Status: DC | PRN

## 2012-01-29 NOTE — Patient Instructions (Addendum)
See me as needed  Labs will be mailed to you 

## 2012-01-29 NOTE — Progress Notes (Signed)
Subjective:    Patient ID: Brianna Gregory, female    DOB: [birth year], 78 y.o.   MRN: [id number]  HPI  Brianna Gregory is here for comprehensive evaluation.  Overall doing well.  She does reports cold feet with numbness in toes.  No pain in calf or with walking.  She has been using an antifungal pen on her toes.  Minimal improvement.    History of rectal polyps  She has not seen a GI MD in [place] as yet  Diverticulosis  Skin CA S/P MOHs surgery  Healing well  Review of Systems  All other systems reviewed and are negative.       Objective:   Physical Exam Physical Exam  Nursing note and vitals reviewed.  Constitutional: She is oriented to person, place, and time. She appears well-developed and well-nourished.  HENT:  Head: Normocephalic and atraumatic.  Right Ear: Tympanic membrane and ear canal normal. No drainage. Tympanic membrane is not injected and not erythematous.  Left Ear: Tympanic membrane and ear canal normal. No drainage. Tympanic membrane is not injected and not erythematous.  Nose: Nose normal. Right sinus exhibits no maxillary sinus tenderness and no frontal sinus tenderness. Left sinus exhibits no maxillary sinus tenderness and no frontal sinus tenderness.  Mouth/Throat: Oropharynx is clear and moist. No oral lesions. No oropharyngeal exudate.  Eyes: Conjunctivae and EOM are normal. Pupils are equal, round, and reactive to light.  Neck: Normal range of motion. Neck supple. No JVD present. Carotid bruit is not present. No mass and no thyromegaly present.  Cardiovascular: Normal rate, regular rhythm, S1 normal, S2 normal and intact distal pulses. Exam reveals no gallop and no friction rub.  No murmur heard.  Pulses:  Carotid pulses are 2+ on the right side, and 2+ on the left side.  Dorsalis pedis pulses are 2+ on the right side, and 2+ on the left side.  No carotid bruit. No LE edema  Pulmonary/Chest: Breath sounds normal. She has no wheezes. She has no rales. She  exhibits no tenderness.  Breast no discrete masses no nipple discharge no axillary adenopathy bilaterally Abdominal: Soft. Bowel sounds are normal. She exhibits no distension and no mass. There is no hepatosplenomegaly. There is no tenderness. There is no CVA tenderness. Rectal no mass guaiac neg Musculoskeletal: Normal range of motion.  No active synovitis to joints.  Lymphadenopathy:  She has no cervical adenopathy.  She has no axillary adenopathy.  Right: No inguinal and no supraclavicular adenopathy present.  Left: No inguinal and no supraclavicular adenopathy present.  Neurological: She is alert and oriented to person, place, and time. She has normal strength and normal reflexes. She displays no tremor. No cranial nerve deficit or sensory deficit. Coordination and gait normal.  Skin: Skin is warm and dry. No rash noted. No cyanosis. Nails show no clubbing.  Psychiatric: She has a normal mood and affect. Her speech is normal and behavior is normal. Cognition and memory are normal.  Vascular:  Feet cold  Pedal pulses decreased but present          Assessment & Plan:  Health Maintenance:  See scanned HM sheet  UTD with all vaccines.  Will set up referral for colonoscopy [facility] today  Rectal polyp  See above will refer to Dr. Russella Dar  Hyperlipidemia  Check today  Toe numbness/cold feet  [facility]   Osteoporosis  S/P compression fx:  Calcium vitamin D  Needs Dexa in 2014  DJD  Diverticulosis  Skin  cancer/toenail fungus: advised to see her dermatologist Dr. Emily Filbert for nail fungus  See me as needed

## 2012-01-31 ENCOUNTER — Telehealth: Payer: Self-pay | Admitting: *Deleted

## 2012-01-31 NOTE — Telephone Encounter (Signed)
Labs mailed to pt home addresss with a copy of dash diet and advice to follow for cholesterol

## 2012-01-31 NOTE — Telephone Encounter (Signed)
Message copied by Mathews Robinsons on Wed Jan 31, 2012  2:43 PM ------      Message from: Raechel Chute D      Created: Tue Jan 30, 2012  8:01 AM       Ok to mail labs to pt.  Write on letter to follow DASH diet

## 2012-02-05 ENCOUNTER — Ambulatory Visit (HOSPITAL_COMMUNITY): Admission: RE | Admit: 2012-02-05 | Payer: Medicare Other | Source: Ambulatory Visit

## 2012-02-06 ENCOUNTER — Telehealth: Payer: Self-pay | Admitting: *Deleted

## 2012-02-06 NOTE — Telephone Encounter (Signed)
Called pt regarding the charge for EKG. After speaking with Brianna Gregory charges will be rescended

## 2012-02-13 ENCOUNTER — Telehealth: Payer: Self-pay | Admitting: Internal Medicine

## 2012-02-13 NOTE — Telephone Encounter (Signed)
See Ardenia's note. Pt was seen and treated by her prior PCP pt is concerned that she now has it in her fingernails.

## 2012-02-13 NOTE — Telephone Encounter (Signed)
I can give her an antifungal nail polish but for an antifungal pill she needs to see Dr. Emily Filbert her dermatologist

## 2012-02-13 NOTE — Telephone Encounter (Signed)
Pt states she has a fungus usually comes on her toes and not it is on her finger tips... She is seeing if there is a medication (pill) that can will be sent to [facility]... Per pt Dr. Reece Levy has treat this before.. Please call pt at [phone]

## 2012-02-15 ENCOUNTER — Other Ambulatory Visit: Payer: Self-pay | Admitting: *Deleted

## 2012-02-19 ENCOUNTER — Other Ambulatory Visit (HOSPITAL_COMMUNITY): Payer: Self-pay | Admitting: *Deleted

## 2012-02-19 ENCOUNTER — Ambulatory Visit (HOSPITAL_COMMUNITY)
Admission: RE | Admit: 2012-02-19 | Discharge: 2012-02-19 | Disposition: A | Discharge status: Home / Self Care | Payer: Medicare Other | Source: Ambulatory Visit | Attending: Internal Medicine | Admitting: Internal Medicine

## 2012-02-19 ENCOUNTER — Encounter: Payer: Self-pay | Admitting: Gastroenterology

## 2012-02-19 ENCOUNTER — Ambulatory Visit (INDEPENDENT_AMBULATORY_CARE_PROVIDER_SITE_OTHER): Payer: Medicare Other | Admitting: Gastroenterology

## 2012-02-19 VITALS — BP 110/60 | HR 64 | Ht 65.0 in | Wt 168.2 lb

## 2012-02-19 DIAGNOSIS — R209 Unspecified disturbances of skin sensation: Secondary | ICD-10-CM | POA: Insufficient documentation

## 2012-02-19 DIAGNOSIS — R200 Anesthesia of skin: Secondary | ICD-10-CM

## 2012-02-19 DIAGNOSIS — K219 Gastro-esophageal reflux disease without esophagitis: Secondary | ICD-10-CM

## 2012-02-19 DIAGNOSIS — R0989 Other specified symptoms and signs involving the circulatory and respiratory systems: Secondary | ICD-10-CM

## 2012-02-19 DIAGNOSIS — Z1211 Encounter for screening for malignant neoplasm of colon: Secondary | ICD-10-CM

## 2012-02-19 DIAGNOSIS — K5900 Constipation, unspecified: Secondary | ICD-10-CM

## 2012-02-19 NOTE — Progress Notes (Signed)
History of Present Illness: This is an 78 year old female referred for colorectal cancer screening and history of colon polyp. Records were reviewed from a prior colonoscopy performed by Dr. Lulu Riding at [facility] in June 2007 showing sigmoid diverticulosis and a small rectal polypoid lesion with pathology showing benign colonic mucosa with lymphoid aggregate. Patient has mild constipation which is treated with when necessary MiraLax. She has chronic GERD which is well controlled on daily omeprazole. Denies weight loss, abdominal pain, diarrhea, change in stool caliber, melena, hematochezia, nausea, vomiting, dysphagia, chest pain.  Review of Systems: Pertinent positive and negative review of systems were noted in the above HPI section. All other review of systems were otherwise negative.  Current Medications, Allergies, Past Medical History, Past Surgical History, Family History and Social History were reviewed in [employer] record.  Physical Exam: General: Well developed , well nourished, no acute distress Head: Normocephalic and atraumatic Eyes:  sclerae anicteric, EOMI Ears: Normal auditory acuity Mouth: No deformity or lesions Neck: Supple, no masses or thyromegaly Lungs: Clear throughout to auscultation Heart: Regular rate and rhythm; no murmurs, rubs or bruits Abdomen: Soft, non tender and non distended. No masses, hepatosplenomegaly or hernias noted. Normal Bowel sounds Musculoskeletal: Symmetrical with no gross deformities  Skin: No lesions on visible extremities Pulses:  Normal pulses noted Extremities: No clubbing, cyanosis, edema or deformities noted Neurological: Alert oriented x 4, grossly nonfocal Cervical Nodes:  No significant cervical adenopathy Inguinal Nodes: No significant inguinal adenopathy Psychological:  Alert and cooperative. Normal mood and affect  Assessment and Recommendations:  1. Colorectal cancer screening, routine risk.  She is not a candidate for routine screening colonoscopy as standard national guidelines recommend discontinuing routine screening and surveillance colonoscopies at age 15. In addition she does not have a history of adenomatous colon polyps.  2. Mild chronic constipation. High fiber diet, increase daily water intake, use MiraLax when necessary.  3. GERD. Continue standard antireflux measures and omeprazole 20 mg daily. GI followup as needed.

## 2012-02-19 NOTE — Progress Notes (Signed)
VASCULAR LAB PRELIMINARY  ARTERIAL  ABI completed:  ABIs and pedal waveforms within normal limits    RIGHT    LEFT    PRESSURE WAVEFORM  PRESSURE WAVEFORM  BRACHIAL 136 Triphasic  BRACHIAL 130 Triphasic   DP 138 Monophasic  DP 147 Biphasic   AT   AT    PT 149 Biphasic  PT 152 Biphasic   PER   PER    GREAT TOE  NA GREAT TOE  NA    RIGHT LEFT  ABI 1.10 1.12     Arvine Clayburn, RVT 02/19/2012, 10:16 AM

## 2012-02-19 NOTE — Patient Instructions (Addendum)
Follow up as needed

## 2012-02-20 ENCOUNTER — Other Ambulatory Visit: Payer: Self-pay | Admitting: *Deleted

## 2012-02-20 NOTE — Telephone Encounter (Signed)
Pt wants a rx for nail fungus

## 2012-02-20 NOTE — Telephone Encounter (Signed)
Called this in to [facility]

## 2012-02-21 MED ORDER — CICLOPIROX 8 % EX SOLN: Freq: Every day | CUTANEOUS | Status: DC

## 2012-05-17 ENCOUNTER — Other Ambulatory Visit: Payer: Self-pay | Admitting: *Deleted

## 2012-05-17 ENCOUNTER — Telehealth: Payer: Self-pay | Admitting: Internal Medicine

## 2012-05-17 MED ORDER — POLYETHYLENE GLYCOL 3350 17 GM/SCOOP PO POWD: 17.0000 g | Freq: Every day | ORAL | Status: DC | PRN

## 2012-05-17 MED ORDER — TRAMADOL HCL 50 MG PO TABS: 50.0000 mg | ORAL_TABLET | Freq: Three times a day (TID) | ORAL | Status: DC | PRN

## 2012-05-17 NOTE — Telephone Encounter (Signed)
Refill request

## 2012-05-17 NOTE — Telephone Encounter (Signed)
  Brianna Gregory   Call pt and let her know that I do not give 270 3 months worth) tablets of pain medicine.  Ask her how many tablets of ultram is she taking every day.  I gave her 90 tablets

## 2012-06-04 ENCOUNTER — Ambulatory Visit (INDEPENDENT_AMBULATORY_CARE_PROVIDER_SITE_OTHER): Payer: Medicare Other | Admitting: Internal Medicine

## 2012-06-04 ENCOUNTER — Telehealth: Payer: Self-pay | Admitting: *Deleted

## 2012-06-04 ENCOUNTER — Encounter: Payer: Self-pay | Admitting: Internal Medicine

## 2012-06-04 VITALS — BP 132/78 | HR 75 | Temp 97.0°F | Resp 18 | Wt 170.0 lb

## 2012-06-04 DIAGNOSIS — M1990 Unspecified osteoarthritis, unspecified site: Secondary | ICD-10-CM

## 2012-06-04 DIAGNOSIS — R209 Unspecified disturbances of skin sensation: Secondary | ICD-10-CM

## 2012-06-04 DIAGNOSIS — M25579 Pain in unspecified ankle and joints of unspecified foot: Secondary | ICD-10-CM | POA: Insufficient documentation

## 2012-06-04 DIAGNOSIS — M129 Arthropathy, unspecified: Secondary | ICD-10-CM

## 2012-06-04 DIAGNOSIS — M25571 Pain in right ankle and joints of right foot: Secondary | ICD-10-CM

## 2012-06-04 DIAGNOSIS — N9089 Other specified noninflammatory disorders of vulva and perineum: Secondary | ICD-10-CM

## 2012-06-04 DIAGNOSIS — Z139 Encounter for screening, unspecified: Secondary | ICD-10-CM

## 2012-06-04 NOTE — Progress Notes (Signed)
Subjective:    Patient ID: Brianna Gregory, female    DOB: 07-06-1926, 79 y.o.   MRN: [id number]  HPI Brianna Gregory is here with acute visit.    She tells me she feels a soreness and "tearing feeling" but is not sure if it is coming from labial area or from rectum.  She is not sexually active.  She does have constipation off and on.  She denies frank blood or dark stools.  She denies dysuria or urgency  She did see a dermatologist for nail dystrophy.  She has some numbness in fingertips  She has an old ankle sprain and would like to see Dr. Pearletha Forge about this Allergies  Allergen Reactions  . Statins Other (See Comments)    myalgias   Past Medical History  Diagnosis Date  . Menopause   . Lumbar herniated disc   . Arthritis   . Skin cancer   . Glaucoma   . Urge incontinence   . Diverticulosis     colonoscopy 2007  . HLD (hyperlipidemia)    Past Surgical History  Procedure Laterality Date  . Abdominal hysterectomy  1985  . Fixation kyphoplasty lumbar spine  2005  . Inguinal hernia repair  1947    right  . Appendectomy  1947  . Cataract extraction      bilateral  . Inguinal hernia repair  1971    left   History   Social History  . Marital Status: Married    Spouse Name: N/A    Number of Children: 4  . Years of Education: N/A   Occupational History  . retired Engineer, civil (consulting)    Social History Main Topics  . Smoking status: Never Smoker   . Smokeless tobacco: Never Used  . Alcohol Use: 1.2 oz/week    2 Glasses of wine per week  . Drug Use: No  . Sexually Active: Not Currently   Other Topics Concern  . Not on file   Social History Narrative  . No narrative on file   Family History  Problem Relation Age of Onset  . Heart failure Mother   . Diabetes Mother   . Heart failure Father   . Diabetes Father   . Breast cancer Sister   . Diabetes Sister    Patient Active Problem List  Diagnosis  . Arthritis  . Skin cancer  . Menopause  . Glaucoma  . Lumbar herniated  disc  . Urge incontinence  . Diverticulosis  . Heel spur  . Rectal polyp  . Headache  . Back pain  . History of compression fracture of spine  . GERD (gastroesophageal reflux disease)  . Other and unspecified hyperlipidemia  . Numbness of toes  . Osteoporosis  . Labial lesion  . Paresthesias/numbness  . Pain in joint, ankle and foot   Current Outpatient Prescriptions on File Prior to Visit  Medication Sig Dispense Refill  . Carboxymethylcell-Hypromellose (GENTEAL) 0.25-0.3 % GEL Apply to eye at bedtime.        . celecoxib (CELEBREX) 200 MG capsule Take 1 capsule (200 mg total) by mouth 2 (two) times daily.  180 capsule  3  . Hypromellose (GENTEAL) 0.3 % SOLN Apply 1 drop to eye as needed.       . Misc Natural Products (OSTEO BI-FLEX JOINT SHIELD PO) Take 1 tablet by mouth daily.      . Multiple Vitamins-Minerals (CENTRUM SILVER PO) Take 1 tablet by mouth daily.        Marland Kitchen omeprazole (PRILOSEC) 20  MG capsule Take 1 capsule (20 mg total) by mouth daily.  90 capsule  1  . polyethylene glycol powder (GLYCOLAX/MIRALAX) powder Take 17 g by mouth daily as needed.  255 g  1  . timolol (BETIMOL) 0.5 % ophthalmic solution Place 1 drop into both eyes daily.        . traMADol (ULTRAM) 50 MG tablet Take 1 tablet (50 mg total) by mouth every 8 (eight) hours as needed.  90 tablet  3  . ciclopirox (PENLAC) 8 % solution Apply topically at bedtime. Apply over nail and surrounding skin. Apply daily over previous coat. After seven (7) days, may remove with alcohol and continue cycle.  6.6 mL  0   No current facility-administered medications on file prior to visit.      Review of Systems See HPI    Objective:   Physical Exam  Physical Exam  Nursing note and vitals reviewed.  Constitutional: She is oriented to person, place, and time. She appears well-developed and well-nourished.  HENT:  Head: Normocephalic and atraumatic.  Cardiovascular: Normal rate and regular rhythm. Exam reveals no gallop  and no friction rub.  No murmur heard.  Pulmonary/Chest: Breath sounds normal. She has no wheezes. She has no rales.  Pelvic  Limited to external exam Labia   She does have a few excoriated lesion on labia majora.  No other lesions found   Rectal no mass guaiac neg Neurological: She is alert and oriented to person, place, and time.  Skin: Skin is warm and dry.  Psychiatric: She has a normal mood and affect. Her behavior is normal.            Assessment & Plan:  Labial excoriations:  May be due to dryness and/or urinary leakage from urge incontinence but will refer to gyn to assess for biopsy.  I did give a few sample of luvena moisurizer to use externally.  She is to stop 3-4 days prior to GYN appt.  She may also use Preparation H with HC suppositories for the next 7 days  Numbness in fingers  Advised pt to see Dr. Vickey Huger  I gave her the number  R ankle pain  History of old sprain :  She would like referral to Dr. Pearletha Forge  Will refer   See me in office if not better

## 2012-06-04 NOTE — Telephone Encounter (Signed)
Notified pt of upcoming appt with Dr Ardelle Anton on 06/13/12 at 2:30

## 2012-06-04 NOTE — Patient Instructions (Addendum)
Use Preparation H with 1% hydrocortisone  suppositoriess after each BM for 7 days  Use Luvena creme to labia  Nightly .  Stop 3 days before appt with OB gyn

## 2012-06-11 ENCOUNTER — Ambulatory Visit: Payer: Medicare Other | Admitting: Family Medicine

## 2012-06-11 ENCOUNTER — Encounter: Payer: Self-pay | Admitting: Family Medicine

## 2012-06-11 ENCOUNTER — Ambulatory Visit (INDEPENDENT_AMBULATORY_CARE_PROVIDER_SITE_OTHER): Payer: Medicare Other | Admitting: Family Medicine

## 2012-06-11 VITALS — BP 140/92 | HR 84 | Ht 66.0 in | Wt 168.0 lb

## 2012-06-11 DIAGNOSIS — M25561 Pain in right knee: Secondary | ICD-10-CM

## 2012-06-11 DIAGNOSIS — M25579 Pain in unspecified ankle and joints of unspecified foot: Secondary | ICD-10-CM

## 2012-06-11 DIAGNOSIS — M25569 Pain in unspecified knee: Secondary | ICD-10-CM

## 2012-06-11 DIAGNOSIS — M25571 Pain in right ankle and joints of right foot: Secondary | ICD-10-CM

## 2012-06-12 ENCOUNTER — Encounter: Payer: Self-pay | Admitting: Family Medicine

## 2012-06-12 DIAGNOSIS — M25571 Pain in right ankle and joints of right foot: Secondary | ICD-10-CM | POA: Insufficient documentation

## 2012-06-12 DIAGNOSIS — M25561 Pain in right knee: Secondary | ICD-10-CM | POA: Insufficient documentation

## 2012-06-12 NOTE — Progress Notes (Signed)
Subjective:    Patient ID: Brianna Gregory, female    DOB: [birth year], 79 y.o.   MRN: [id number]  PCP: Dr. Constance Goltz  HPI 79 yo F here for right ankle and knee pain.  Patient reports about 3 years ago she recalls spraining her right ankle. Improved from this without any problems. Then recently feels like lateral ankle pain coming on with activities down to toes. No swelling or bruising. No giving out of ankle. Takes celebrex daily (mostly for back problems). Not doing any exercises, bracing for this. Also reports right knee has been more puffy and swollen. Some mild pain in front and posterior knee but not enough to do anything about per patient. No catching, locking, giving out of knee.  Past Medical History  Diagnosis Date  . Menopause   . Lumbar herniated disc   . Arthritis   . Skin cancer   . Glaucoma   . Urge incontinence   . Diverticulosis     colonoscopy 2007  . HLD (hyperlipidemia)     Current Outpatient Prescriptions on File Prior to Visit  Medication Sig Dispense Refill  . Carboxymethylcell-Hypromellose (GENTEAL) 0.25-0.3 % GEL Apply to eye at bedtime.        . celecoxib (CELEBREX) 200 MG capsule Take 1 capsule (200 mg total) by mouth 2 (two) times daily.  180 capsule  3  . ciclopirox (PENLAC) 8 % solution Apply topically at bedtime. Apply over nail and surrounding skin. Apply daily over previous coat. After seven (7) days, may remove with alcohol and continue cycle.  6.6 mL  0  . Hypromellose (GENTEAL) 0.3 % SOLN Apply 1 drop to eye as needed.       . Misc Natural Products (OSTEO BI-FLEX JOINT SHIELD PO) Take 1 tablet by mouth daily.      . Multiple Vitamins-Minerals (CENTRUM SILVER PO) Take 1 tablet by mouth daily.        Marland Kitchen omeprazole (PRILOSEC) 20 MG capsule Take 1 capsule (20 mg total) by mouth daily.  90 capsule  1  . polyethylene glycol powder (GLYCOLAX/MIRALAX) powder Take 17 g by mouth daily as needed.  255 g  1  . timolol (BETIMOL) 0.5 % ophthalmic  solution Place 1 drop into both eyes daily.        . traMADol (ULTRAM) 50 MG tablet Take 1 tablet (50 mg total) by mouth every 8 (eight) hours as needed.  90 tablet  3   No current facility-administered medications on file prior to visit.    Past Surgical History  Procedure Laterality Date  . Abdominal hysterectomy  1985  . Fixation kyphoplasty lumbar spine  2005  . Inguinal hernia repair  1947    right  . Appendectomy  1947  . Cataract extraction      bilateral  . Inguinal hernia repair  1971    left    Allergies  Allergen Reactions  . Statins Other (See Comments)    myalgias    History   Social History  . Marital Status: Married    Spouse Name: N/A    Number of Children: 4  . Years of Education: N/A   Occupational History  . retired Engineer, civil (consulting)    Social History Main Topics  . Smoking status: Never Smoker   . Smokeless tobacco: Never Used  . Alcohol Use: 1.2 oz/week    2 Glasses of wine per week  . Drug Use: No  . Sexually Active: Not Currently   Other Topics Concern  .  Not on file   Social History Narrative  . No narrative on file    Family History  Problem Relation Age of Onset  . Heart failure Mother   . Diabetes Mother   . Heart failure Father   . Diabetes Father   . Breast cancer Sister   . Diabetes Sister     BP 140/92  Pulse 84  Ht 5\' 6"  (1.676 m)  Wt 168 lb (76.204 kg)  BMI 27.13 kg/m2  Review of Systems See HPI above.    Objective:   Physical Exam Gen: NAD  R ankle: No gross deformity, swelling, ecchymoses FROM with 5/5 strength all directions. No TTP Negative ant drawer and talar tilt.   Negative syndesmotic compression. Thompsons test negative. NV intact distally.  R knee: Mild effusion.  No other deformity, ecchymoses. No TTP joint lines or post patellar facets. FROM. Negative ant/post drawers. Negative valgus/varus testing. Negative lachmanns. Negative mcmurrays, apleys, patellar apprehension. NV intact distally.      Assessment & Plan:  1. Right ankle pain - benign exam though describes possible intermittent peroneal tendinopathy.  Advised to let me know if this starts to hamper her activities and come in when she is in pain here.  2. Right knee pain - mild effusion but patient not experiencing much pain.  Advised I'd continue with conservative care - tylenol, celebrex, glucosamine, topical medications (like capsaicin).  Consider radiographs, cortisone injection if not improving.

## 2012-06-12 NOTE — Assessment & Plan Note (Signed)
mild effusion but patient not experiencing much pain.  Advised I'd continue with conservative care - tylenol, celebrex, glucosamine, topical medications (like capsaicin).  Consider radiographs, cortisone injection if not improving.

## 2012-06-12 NOTE — Assessment & Plan Note (Signed)
benign exam though describes possible intermittent peroneal tendinopathy.  Advised to let me know if this starts to hamper her activities and come in when she is in pain here.

## 2012-06-20 ENCOUNTER — Telehealth: Payer: Self-pay | Admitting: Internal Medicine

## 2012-06-20 NOTE — Telephone Encounter (Signed)
Pt states she needs to know exactly what is going on with her.. She was sent to Dr. Loreta Ave and was prescribe to medications one to go inside the vagina clindamycin phostate and outside triamcinola oinment... She wants to know if it was a yeast infection or a fungus... pls call pt at [phone]

## 2012-06-21 ENCOUNTER — Telehealth: Payer: Self-pay | Admitting: Internal Medicine

## 2012-06-21 ENCOUNTER — Other Ambulatory Visit: Payer: Self-pay | Admitting: *Deleted

## 2012-06-21 NOTE — Telephone Encounter (Signed)
Refill request

## 2012-06-21 NOTE — Telephone Encounter (Signed)
Pt needs refill on omeprazole (PRILOSEC) 20 MG capsule [facility]... If any questions [phone].Marland KitchenMarland Kitchenpt was advise to give nurse 24 to 48 hours to fill out .Marland KitchenMarland Kitchen

## 2012-06-23 MED ORDER — OMEPRAZOLE 20 MG PO CPDR: 20.0000 mg | DELAYED_RELEASE_CAPSULE | Freq: Every day | ORAL | Status: DC

## 2012-07-18 ENCOUNTER — Telehealth: Payer: Self-pay | Admitting: Internal Medicine

## 2012-07-18 NOTE — Telephone Encounter (Signed)
Pt would like to discuss note from Dr Ardelle Anton office.  Would like to know if she needs to do lab for arterial venous?  Pt call back phone number [phone].

## 2012-09-16 ENCOUNTER — Telehealth: Payer: Self-pay | Admitting: *Deleted

## 2012-09-16 MED ORDER — CELECOXIB 200 MG PO CAPS: 200.0000 mg | ORAL_CAPSULE | Freq: Two times a day (BID) | ORAL | Status: DC

## 2012-09-16 NOTE — Telephone Encounter (Signed)
Needs refill on Celebrex called into [facility].

## 2012-09-16 NOTE — Telephone Encounter (Signed)
Refill request. Sent in a 30 day supply to [facility]

## 2012-10-21 ENCOUNTER — Other Ambulatory Visit: Payer: Self-pay | Admitting: *Deleted

## 2012-10-21 ENCOUNTER — Telehealth: Payer: Self-pay | Admitting: *Deleted

## 2012-10-21 NOTE — Telephone Encounter (Signed)
Refill request

## 2012-10-21 NOTE — Telephone Encounter (Signed)
Pt needs refill

## 2012-10-22 NOTE — Telephone Encounter (Signed)
Brianna Gregory  Call pt and let her know that 200 mg bid is a very high dose and can cause  Too much GI upset.    Ask if she is in fact using 200 mb bid .  I would like to change her to 100 mg bid.  Schedule office visit with me to discuss if she wants

## 2012-10-25 ENCOUNTER — Other Ambulatory Visit: Payer: Self-pay | Admitting: Internal Medicine

## 2012-10-25 DIAGNOSIS — M129 Arthropathy, unspecified: Secondary | ICD-10-CM

## 2012-11-12 ENCOUNTER — Other Ambulatory Visit: Payer: Self-pay | Admitting: Internal Medicine

## 2012-11-12 NOTE — Telephone Encounter (Signed)
Refill request

## 2012-11-21 ENCOUNTER — Other Ambulatory Visit: Payer: Self-pay | Admitting: *Deleted

## 2012-11-21 NOTE — Telephone Encounter (Signed)
Refill request

## 2012-11-23 MED ORDER — OMEPRAZOLE 20 MG PO CPDR: 20.0000 mg | DELAYED_RELEASE_CAPSULE | Freq: Every day | ORAL | Status: DC

## 2012-12-24 ENCOUNTER — Telehealth: Payer: Self-pay | Admitting: *Deleted

## 2012-12-24 NOTE — Telephone Encounter (Signed)
Refill request

## 2012-12-25 ENCOUNTER — Other Ambulatory Visit: Payer: Self-pay | Admitting: *Deleted

## 2012-12-25 NOTE — Telephone Encounter (Signed)
Refill request

## 2012-12-26 MED ORDER — POLYETHYLENE GLYCOL 3350 17 GM/SCOOP PO POWD: ORAL | Status: DC

## 2013-03-06 ENCOUNTER — Encounter: Payer: Medicare Other | Admitting: Internal Medicine

## 2013-03-24 ENCOUNTER — Other Ambulatory Visit: Payer: Self-pay | Admitting: Internal Medicine

## 2013-03-24 NOTE — Telephone Encounter (Signed)
Refill request

## 2013-05-18 ENCOUNTER — Other Ambulatory Visit: Payer: Self-pay | Admitting: Internal Medicine

## 2013-05-19 NOTE — Telephone Encounter (Signed)
Refill request

## 2013-07-16 ENCOUNTER — Ambulatory Visit (INDEPENDENT_AMBULATORY_CARE_PROVIDER_SITE_OTHER): Payer: Medicare Other | Admitting: Family Medicine

## 2013-07-16 ENCOUNTER — Encounter: Payer: Self-pay | Admitting: Family Medicine

## 2013-07-16 VITALS — BP 129/82 | HR 78 | Ht 66.0 in | Wt 168.0 lb

## 2013-07-16 DIAGNOSIS — S8990XA Unspecified injury of unspecified lower leg, initial encounter: Secondary | ICD-10-CM

## 2013-07-16 DIAGNOSIS — S99929A Unspecified injury of unspecified foot, initial encounter: Secondary | ICD-10-CM

## 2013-07-16 DIAGNOSIS — S99919A Unspecified injury of unspecified ankle, initial encounter: Secondary | ICD-10-CM

## 2013-07-16 DIAGNOSIS — S8991XA Unspecified injury of right lower leg, initial encounter: Secondary | ICD-10-CM

## 2013-07-16 NOTE — Patient Instructions (Signed)
You either suffered a mild sprain of this knee or flared the arthritis. Ice knee 15 minutes at a time 3 times a day for next week. Ibuprofen 600mg  three times a day with food OR aleve 2 tabs twice a day with food for next week. ACE wrap for compression for swelling if needed. Start straight leg raises, knee extensions now 3 sets of 10 once a day. Follow up with me as needed. Wait about 2 weeks before going back to your full exercise program with squats, lunges, pushing up from a chair.

## 2013-07-17 ENCOUNTER — Encounter: Payer: Self-pay | Admitting: Family Medicine

## 2013-07-17 DIAGNOSIS — S8991XA Unspecified injury of right lower leg, initial encounter: Secondary | ICD-10-CM | POA: Insufficient documentation

## 2013-07-17 NOTE — Progress Notes (Signed)
Patient ID: Brianna Gregory, female   DOB: 02-15-1927, 80 y.o.   MRN: [id number]  PCP: Kelton Pillar, MD  Subjective:   HPI: Patient is a 80 y.o. female here for right knee pain.  Patient reports she was doing wellness testing at [facility]. Around 11am during this she pushed up from chair, was on way around a cone when it felt like her right knee buckled. Did not fall down. Did not hyperextend or obviously twist it that she noticed. Caused pain lateral and posterior knee. Has old radiographs that showed arthritis, chondrocalcinosis. No catching or locking.  Past Medical History  Diagnosis Date  . Menopause   . Lumbar herniated disc   . Arthritis   . Skin cancer   . Glaucoma   . Urge incontinence   . Diverticulosis     colonoscopy 2007  . HLD (hyperlipidemia)     Current Outpatient Prescriptions on File Prior to Visit  Medication Sig Dispense Refill  . Carboxymethylcell-Hypromellose (GENTEAL) 0.25-0.3 % GEL Apply to eye at bedtime.        . CELEBREX 200 MG capsule TAKE 1 CAPSULE TWICE DAILY  180 capsule  0  . ciclopirox (PENLAC) 8 % solution Apply topically at bedtime. Apply over nail and surrounding skin. Apply daily over previous coat. After seven (7) days, may remove with alcohol and continue cycle.  6.6 mL  0  . Hypromellose (GENTEAL) 0.3 % SOLN Apply 1 drop to eye as needed.       . Misc Natural Products (OSTEO BI-FLEX JOINT SHIELD PO) Take 1 tablet by mouth daily.      . Multiple Vitamins-Minerals (CENTRUM SILVER PO) Take 1 tablet by mouth daily.        Marland Kitchen omeprazole (PRILOSEC) 20 MG capsule TAKE 1 CAPSULE DAILY  90 capsule  1  . polyethylene glycol powder (GLYCOLAX/MIRALAX) powder MIX 17 GRAMS (1 TABLESPOONFUL) IN LIQUID AND DRINK DAILY  119 g  1  . polyethylene glycol powder (GLYCOLAX/MIRALAX) powder Take as directed on bottle  255 g  0  . timolol (BETIMOL) 0.5 % ophthalmic solution Place 1 drop into both eyes daily.        . traMADol (ULTRAM) 50 MG tablet  Take 1 tablet (50 mg total) by mouth every 8 (eight) hours as needed.  90 tablet  3   No current facility-administered medications on file prior to visit.    Past Surgical History  Procedure Laterality Date  . Abdominal hysterectomy  1985  . Fixation kyphoplasty lumbar spine  2005  . Inguinal hernia repair  1947    right  . Appendectomy  1947  . Cataract extraction      bilateral  . Inguinal hernia repair  1971    left    Allergies  Allergen Reactions  . Statins Other (See Comments)    myalgias    History   Social History  . Marital Status: Married    Spouse Name: N/A    Number of Children: 4  . Years of Education: N/A   Occupational History  . retired Marine scientist    Social History Main Topics  . Smoking status: Never Smoker   . Smokeless tobacco: Never Used  . Alcohol Use: 1.2 oz/week    2 Glasses of wine per week  . Drug Use: No  . Sexual Activity: Not Currently   Other Topics Concern  . Not on file   Social History Narrative  . No narrative on file    Family History  Problem Relation Age of Onset  . Heart failure Mother   . Diabetes Mother   . Heart failure Father   . Diabetes Father   . Breast cancer Sister   . Diabetes Sister     BP 129/82  Pulse 78  Ht 5\' 6"  (1.676 m)  Wt 168 lb (76.204 kg)  BMI 27.13 kg/m2  Review of Systems: See HPI above.    Objective:  Physical Exam:  Gen: NAD  Right knee: No gross deformity, ecchymoses, effusion. Mild medial joint line tenderness.  No other TTP. FROM. Negative ant/post drawers. Negative valgus/varus testing. Negative lachmanns. Negative mcmurrays, apleys, patellar apprehension. NV intact distally.    Assessment & Plan:  1. Right knee injury - exam is benign.  Did not fall and only thing on exam is some medial joint line tenderness.  Suspect mild sprain or flare of underlying arthritis.  Will treat conservatively - icing, nsaids, ace wrap.  Start quad strengthening.  Wait 2 weeks before returning  to full exercise program.  F/u prn.

## 2013-07-17 NOTE — Assessment & Plan Note (Signed)
exam is benign.  Did not fall and only thing on exam is some medial joint line tenderness.  Suspect mild sprain or flare of underlying arthritis.  Will treat conservatively - icing, nsaids, ace wrap.  Start quad strengthening.  Wait 2 weeks before returning to full exercise program.  F/u prn.

## 2013-08-20 ENCOUNTER — Ambulatory Visit (INDEPENDENT_AMBULATORY_CARE_PROVIDER_SITE_OTHER): Payer: Medicare Other | Admitting: Family Medicine

## 2013-08-20 ENCOUNTER — Encounter: Payer: Self-pay | Admitting: Family Medicine

## 2013-08-20 VITALS — BP 139/84 | HR 73 | Ht 66.0 in | Wt 166.0 lb

## 2013-08-20 DIAGNOSIS — M25569 Pain in unspecified knee: Secondary | ICD-10-CM

## 2013-08-20 DIAGNOSIS — S8990XA Unspecified injury of unspecified lower leg, initial encounter: Secondary | ICD-10-CM

## 2013-08-20 DIAGNOSIS — S99929A Unspecified injury of unspecified foot, initial encounter: Secondary | ICD-10-CM

## 2013-08-20 DIAGNOSIS — M25561 Pain in right knee: Secondary | ICD-10-CM

## 2013-08-20 DIAGNOSIS — S99919A Unspecified injury of unspecified ankle, initial encounter: Secondary | ICD-10-CM

## 2013-08-20 DIAGNOSIS — S8991XA Unspecified injury of right lower leg, initial encounter: Secondary | ICD-10-CM

## 2013-08-20 MED ORDER — METHYLPREDNISOLONE ACETATE 40 MG/ML IJ SUSP
40.0000 mg | Freq: Once | INTRAMUSCULAR | Status: AC
  Administered 2013-08-20: 40 mg via INTRA_ARTICULAR

## 2013-08-25 ENCOUNTER — Encounter: Payer: Self-pay | Admitting: Family Medicine

## 2013-08-25 NOTE — Progress Notes (Signed)
Patient ID: Brianna Gregory, female   DOB: 10-05-1926, 80 y.o.   MRN: [id number]  PCP: Kelton Pillar, MD  Subjective:   HPI: Patient is a 80 y.o. female here for right knee pain.  4/22: Patient reports she was doing wellness testing at [facility]. Around 11am during this she pushed up from chair, was on way around a cone when it felt like her right knee buckled. Did not fall down. Did not hyperextend or obviously twist it that she noticed. Caused pain lateral and posterior knee. Has old radiographs that showed arthritis, chondrocalcinosis. No catching or locking.  5/27: Patient reports pain still present in knee, about 6/10 level. Some swelling. Has been icing, taking ibuprofen and tramadol as needed. No new injuries.  Past Medical History  Diagnosis Date  . Menopause   . Lumbar herniated disc   . Arthritis   . Skin cancer   . Glaucoma   . Urge incontinence   . Diverticulosis     colonoscopy 2007  . HLD (hyperlipidemia)     Current Outpatient Prescriptions on File Prior to Visit  Medication Sig Dispense Refill  . Carboxymethylcell-Hypromellose (GENTEAL) 0.25-0.3 % GEL Apply to eye at bedtime.        . CELEBREX 200 MG capsule TAKE 1 CAPSULE TWICE DAILY  180 capsule  0  . ciclopirox (PENLAC) 8 % solution Apply topically at bedtime. Apply over nail and surrounding skin. Apply daily over previous coat. After seven (7) days, may remove with alcohol and continue cycle.  6.6 mL  0  . Hypromellose (GENTEAL) 0.3 % SOLN Apply 1 drop to eye as needed.       . Misc Natural Products (OSTEO BI-FLEX JOINT SHIELD PO) Take 1 tablet by mouth daily.      . Multiple Vitamins-Minerals (CENTRUM SILVER PO) Take 1 tablet by mouth daily.        Marland Kitchen omeprazole (PRILOSEC) 20 MG capsule TAKE 1 CAPSULE DAILY  90 capsule  1  . polyethylene glycol powder (GLYCOLAX/MIRALAX) powder MIX 17 GRAMS (1 TABLESPOONFUL) IN LIQUID AND DRINK DAILY  119 g  1  . polyethylene glycol powder (GLYCOLAX/MIRALAX)  powder Take as directed on bottle  255 g  0  . timolol (BETIMOL) 0.5 % ophthalmic solution Place 1 drop into both eyes daily.        . traMADol (ULTRAM) 50 MG tablet Take 1 tablet (50 mg total) by mouth every 8 (eight) hours as needed.  90 tablet  3   No current facility-administered medications on file prior to visit.    Past Surgical History  Procedure Laterality Date  . Abdominal hysterectomy  1985  . Fixation kyphoplasty lumbar spine  2005  . Inguinal hernia repair  1947    right  . Appendectomy  1947  . Cataract extraction      bilateral  . Inguinal hernia repair  1971    left    Allergies  Allergen Reactions  . Statins Other (See Comments)    myalgias    History   Social History  . Marital Status: Married    Spouse Name: N/A    Number of Children: 4  . Years of Education: N/A   Occupational History  . retired Marine scientist    Social History Main Topics  . Smoking status: Never Smoker   . Smokeless tobacco: Never Used  . Alcohol Use: 1.2 oz/week    2 Glasses of wine per week  . Drug Use: No  . Sexual Activity: Not Currently  Other Topics Concern  . Not on file   Social History Narrative  . No narrative on file    Family History  Problem Relation Age of Onset  . Heart failure Mother   . Diabetes Mother   . Heart failure Father   . Diabetes Father   . Breast cancer Sister   . Diabetes Sister     BP 139/84  Pulse 73  Ht 5\' 6"  (1.676 m)  Wt 166 lb (75.297 kg)  BMI 26.81 kg/m2  Review of Systems: See HPI above.    Objective:  Physical Exam:  Gen: NAD  Right knee: No gross deformity, ecchymoses, effusion. Mild medial joint line tenderness.  No other TTP. FROM. Negative ant/post drawers. Negative valgus/varus testing. Negative lachmanns. Negative mcmurrays, apleys, patellar apprehension. NV intact distally.    Assessment & Plan:  1. Right knee injury - exam is benign.  2/2 flare of underlying DJD>  Continue with icing, nsaids, quad  strengthening.  Injection given today.  F/u prn.  After informed written consent, patient was lying supine on exam table. Right knee was prepped with alcohol swab and utilizing superolateral approach with ultrasound guidance, patient's right knee was injected intraarticularly with 3:1 marcaine: depomedrol. Patient tolerated the procedure well without immediate complications.

## 2013-08-25 NOTE — Assessment & Plan Note (Signed)
exam is benign.  2/2 flare of underlying DJD>  Continue with icing, nsaids, quad strengthening.  Injection given today.  F/u prn.  After informed written consent, patient was lying supine on exam table. Right knee was prepped with alcohol swab and utilizing superolateral approach with ultrasound guidance, patient's right knee was injected intraarticularly with 3:1 marcaine: depomedrol. Patient tolerated the procedure well without immediate complications.

## 2013-10-11 ENCOUNTER — Other Ambulatory Visit: Payer: Self-pay | Admitting: Internal Medicine

## 2013-10-13 ENCOUNTER — Encounter: Payer: Self-pay | Admitting: Family Medicine

## 2013-10-13 ENCOUNTER — Ambulatory Visit (INDEPENDENT_AMBULATORY_CARE_PROVIDER_SITE_OTHER): Payer: Medicare Other | Admitting: Family Medicine

## 2013-10-13 VITALS — BP 150/89 | HR 73 | Ht 66.0 in | Wt 167.0 lb

## 2013-10-13 DIAGNOSIS — M25569 Pain in unspecified knee: Secondary | ICD-10-CM

## 2013-10-13 DIAGNOSIS — M25561 Pain in right knee: Secondary | ICD-10-CM

## 2013-10-13 NOTE — Telephone Encounter (Signed)
Requested Medications     Medication name:  Name from pharmacy:  omeprazole (PRILOSEC) 20 MG capsule  OMEPRAZOLE CAPS 20MG     Sig: TAKE 1 CAPSULE DAILY    Dispense: 90 capsule Refills: 0 Start: 10/11/2013  Class: Normal    Requested on: 10/11/2013    Originally ordered on: 05/25/2011 Last refill: 08/01/2013 Order History and Details

## 2013-10-15 ENCOUNTER — Encounter: Payer: Self-pay | Admitting: Family Medicine

## 2013-10-15 NOTE — Assessment & Plan Note (Signed)
exam is benign.  2/2 flare of underlying DJD.  Patient improved following injection.  Continue with icing, tylenol as needed, quad strengthening.  F/u prn.

## 2013-10-15 NOTE — Progress Notes (Signed)
Patient ID: Brianna Gregory, female   DOB: 08-15-1926, 80 y.o.   MRN: [id number]  PCP: Kelton Pillar, MD  Subjective:   HPI: Patient is a 80 y.o. female here for right knee pain.  4/22: Patient reports she was doing wellness testing at [facility]. Around 11am during this she pushed up from chair, was on way around a cone when it felt like her right knee buckled. Did not fall down. Did not hyperextend or obviously twist it that she noticed. Caused pain lateral and posterior knee. Has old radiographs that showed arthritis, chondrocalcinosis. No catching or locking.  5/27: Patient reports pain still present in knee, about 6/10 level. Some swelling. Has been icing, taking ibuprofen and tramadol as needed. No new injuries.  7/20: Patient reports she is doing very well. Takes tylenol as needed. Only had two times where knee was sore - once with water aerobics and another with bocce. Twinge when pushing on lateral aspect of knee when this did hurt. Injection helped. Has been water walking as well. No current complaints.  Past Medical History  Diagnosis Date  . Menopause   . Lumbar herniated disc   . Arthritis   . Skin cancer   . Glaucoma   . Urge incontinence   . Diverticulosis     colonoscopy 2007  . HLD (hyperlipidemia)     Current Outpatient Prescriptions on File Prior to Visit  Medication Sig Dispense Refill  . Carboxymethylcell-Hypromellose (GENTEAL) 0.25-0.3 % GEL Apply to eye at bedtime.        . CELEBREX 200 MG capsule TAKE 1 CAPSULE TWICE DAILY  180 capsule  0  . ciclopirox (PENLAC) 8 % solution Apply topically at bedtime. Apply over nail and surrounding skin. Apply daily over previous coat. After seven (7) days, may remove with alcohol and continue cycle.  6.6 mL  0  . Hypromellose (GENTEAL) 0.3 % SOLN Apply 1 drop to eye as needed.       . Misc Natural Products (OSTEO BI-FLEX JOINT SHIELD PO) Take 1 tablet by mouth daily.      . Multiple  Vitamins-Minerals (CENTRUM SILVER PO) Take 1 tablet by mouth daily.        Marland Kitchen omeprazole (PRILOSEC) 20 MG capsule TAKE 1 CAPSULE DAILY  90 capsule  1  . polyethylene glycol powder (GLYCOLAX/MIRALAX) powder MIX 17 GRAMS (1 TABLESPOONFUL) IN LIQUID AND DRINK DAILY  119 g  1  . polyethylene glycol powder (GLYCOLAX/MIRALAX) powder Take as directed on bottle  255 g  0  . timolol (BETIMOL) 0.5 % ophthalmic solution Place 1 drop into both eyes daily.        . traMADol (ULTRAM) 50 MG tablet Take 1 tablet (50 mg total) by mouth every 8 (eight) hours as needed.  90 tablet  3   No current facility-administered medications on file prior to visit.    Past Surgical History  Procedure Laterality Date  . Abdominal hysterectomy  1985  . Fixation kyphoplasty lumbar spine  2005  . Inguinal hernia repair  1947    right  . Appendectomy  1947  . Cataract extraction      bilateral  . Inguinal hernia repair  1971    left    Allergies  Allergen Reactions  . Statins Other (See Comments)    myalgias    History   Social History  . Marital Status: Married    Spouse Name: N/A    Number of Children: 4  . Years of Education: N/A  Occupational History  . retired Marine scientist    Social History Main Topics  . Smoking status: Never Smoker   . Smokeless tobacco: Never Used  . Alcohol Use: 1.2 oz/week    2 Glasses of wine per week  . Drug Use: No  . Sexual Activity: Not Currently   Other Topics Concern  . Not on file   Social History Narrative  . No narrative on file    Family History  Problem Relation Age of Onset  . Heart failure Mother   . Diabetes Mother   . Heart failure Father   . Diabetes Father   . Breast cancer Sister   . Diabetes Sister     BP 150/89  Pulse 73  Ht 5\' 6"  (1.676 m)  Wt 167 lb (75.751 kg)  BMI 26.97 kg/m2  Review of Systems: See HPI above.    Objective:  Physical Exam:  Gen: NAD  Right knee: No gross deformity, ecchymoses, effusion. No lateral or medial  joint line tenderness.  No other TTP. FROM. Negative ant/post drawers. Negative valgus/varus testing. Negative lachmanns. Negative mcmurrays, apleys, patellar apprehension. NV intact distally.    Assessment & Plan:  1. Right knee injury - exam is benign.  2/2 flare of underlying DJD.  Patient improved following injection.  Continue with icing, tylenol as needed, quad strengthening.  F/u prn.

## 2013-11-13 ENCOUNTER — Ambulatory Visit (INDEPENDENT_AMBULATORY_CARE_PROVIDER_SITE_OTHER): Payer: Medicare Other | Admitting: Family Medicine

## 2013-11-13 ENCOUNTER — Encounter: Payer: Self-pay | Admitting: Family Medicine

## 2013-11-13 VITALS — BP 136/86 | Ht 66.5 in | Wt 167.0 lb

## 2013-11-13 DIAGNOSIS — M25561 Pain in right knee: Secondary | ICD-10-CM

## 2013-11-13 DIAGNOSIS — M25569 Pain in unspecified knee: Secondary | ICD-10-CM

## 2013-11-13 NOTE — Patient Instructions (Signed)
Continue with stretches and the hip raise exercise for your IT band. Otherwise activities and exercise as tolerated. Let me know if you want to do formal physical therapy for this and we can order it.

## 2013-11-17 ENCOUNTER — Encounter: Payer: Self-pay | Admitting: Family Medicine

## 2013-11-17 NOTE — Assessment & Plan Note (Signed)
exam is benign.  Suspect pain currently more from the IT band syndromes, not her arthritis.  Reassured, encouraged to continue HEP and stretches.  Let me know if she wants to try physical therapy.

## 2013-11-17 NOTE — Progress Notes (Signed)
Patient ID: Brianna Gregory, female   DOB: 03-Nov-1926, 80 y.o.   MRN: [id number]  PCP: Kelton Pillar, MD  Subjective:   HPI: Patient is a 80 y.o. female here for right knee pain.  4/22: Patient reports she was doing wellness testing at [facility]. Around 11am during this she pushed up from chair, was on way around a cone when it felt like her right knee buckled. Did not fall down. Did not hyperextend or obviously twist it that she noticed. Caused pain lateral and posterior knee. Has old radiographs that showed arthritis, chondrocalcinosis. No catching or locking.  5/27: Patient reports pain still present in knee, about 6/10 level. Some swelling. Has been icing, taking ibuprofen and tramadol as needed. No new injuries.  7/20: Patient reports she is doing very well. Takes tylenol as needed. Only had two times where knee was sore - once with water aerobics and another with bocce. Twinge when pushing on lateral aspect of knee when this did hurt. Injection helped. Has been water walking as well. No current complaints.  8/20: Patient reports she has had some soreness in quad and lateral right thigh. Doing home exercises even though these make her a little more sore. No redness or warmth. No new injuries. No pain in knee.  Past Medical History  Diagnosis Date  . Menopause   . Lumbar herniated disc   . Arthritis   . Skin cancer   . Glaucoma   . Urge incontinence   . Diverticulosis     colonoscopy 2007  . HLD (hyperlipidemia)     Current Outpatient Prescriptions on File Prior to Visit  Medication Sig Dispense Refill  . Carboxymethylcell-Hypromellose (GENTEAL) 0.25-0.3 % GEL Apply to eye at bedtime.        . CELEBREX 200 MG capsule TAKE 1 CAPSULE TWICE DAILY  180 capsule  0  . ciclopirox (PENLAC) 8 % solution Apply topically at bedtime. Apply over nail and surrounding skin. Apply daily over previous coat. After seven (7) days, may remove with alcohol and  continue cycle.  6.6 mL  0  . Hypromellose (GENTEAL) 0.3 % SOLN Apply 1 drop to eye as needed.       . Misc Natural Products (OSTEO BI-FLEX JOINT SHIELD PO) Take 1 tablet by mouth daily.      . Multiple Vitamins-Minerals (CENTRUM SILVER PO) Take 1 tablet by mouth daily.        Marland Kitchen omeprazole (PRILOSEC) 20 MG capsule TAKE 1 CAPSULE DAILY  90 capsule  1  . polyethylene glycol powder (GLYCOLAX/MIRALAX) powder MIX 17 GRAMS (1 TABLESPOONFUL) IN LIQUID AND DRINK DAILY  119 g  1  . polyethylene glycol powder (GLYCOLAX/MIRALAX) powder Take as directed on bottle  255 g  0  . timolol (BETIMOL) 0.5 % ophthalmic solution Place 1 drop into both eyes daily.        . traMADol (ULTRAM) 50 MG tablet Take 1 tablet (50 mg total) by mouth every 8 (eight) hours as needed.  90 tablet  3   No current facility-administered medications on file prior to visit.    Past Surgical History  Procedure Laterality Date  . Abdominal hysterectomy  1985  . Fixation kyphoplasty lumbar spine  2005  . Inguinal hernia repair  1947    right  . Appendectomy  1947  . Cataract extraction      bilateral  . Inguinal hernia repair  1971    left    Allergies  Allergen Reactions  . Statins Other (See  Comments)    myalgias    History   Social History  . Marital Status: Married    Spouse Name: N/A    Number of Children: 4  . Years of Education: N/A   Occupational History  . retired Marine scientist    Social History Main Topics  . Smoking status: Never Smoker   . Smokeless tobacco: Never Used  . Alcohol Use: 1.2 oz/week    2 Glasses of wine per week  . Drug Use: No  . Sexual Activity: Not Currently   Other Topics Concern  . Not on file   Social History Narrative  . No narrative on file    Family History  Problem Relation Age of Onset  . Heart failure Mother   . Diabetes Mother   . Heart failure Father   . Diabetes Father   . Breast cancer Sister   . Diabetes Sister     BP 136/86  Ht 5' 6.5" (1.689 m)  Wt 167 lb  (75.751 kg)  BMI 26.55 kg/m2  Review of Systems: See HPI above.    Objective:  Physical Exam:  Gen: NAD  Right knee: No gross deformity, ecchymoses, effusion. No lateral or medial joint line tenderness.  No other TTP. Tenderness mild throughout right IT band - also with tenderness on left though. FROM. Negative ant/post drawers. Negative valgus/varus testing. Negative lachmanns. Negative mcmurrays, apleys, patellar apprehension. NV intact distally.    Assessment & Plan:  1. Right knee injury - exam is benign.  Suspect pain currently more from the IT band syndromes, not her arthritis.  Reassured, encouraged to continue HEP and stretches.  Let me know if she wants to try physical therapy.

## 2014-01-26 ENCOUNTER — Encounter: Payer: Self-pay | Admitting: Family Medicine

## 2014-02-22 ENCOUNTER — Encounter (HOSPITAL_BASED_OUTPATIENT_CLINIC_OR_DEPARTMENT_OTHER): Payer: Self-pay

## 2014-02-22 ENCOUNTER — Emergency Department (HOSPITAL_BASED_OUTPATIENT_CLINIC_OR_DEPARTMENT_OTHER)
Admission: EM | Admit: 2014-02-22 | Discharge: 2014-02-22 | Disposition: A | Discharge status: Home / Self Care | Payer: Medicare Other | Attending: Emergency Medicine | Admitting: Emergency Medicine

## 2014-02-22 DIAGNOSIS — R040 Epistaxis: Secondary | ICD-10-CM | POA: Diagnosis present

## 2014-02-22 DIAGNOSIS — M1990 Unspecified osteoarthritis, unspecified site: Secondary | ICD-10-CM | POA: Insufficient documentation

## 2014-02-22 DIAGNOSIS — Z79899 Other long term (current) drug therapy: Secondary | ICD-10-CM | POA: Diagnosis not present

## 2014-02-22 DIAGNOSIS — Z791 Long term (current) use of non-steroidal anti-inflammatories (NSAID): Secondary | ICD-10-CM | POA: Insufficient documentation

## 2014-02-22 DIAGNOSIS — Z87448 Personal history of other diseases of urinary system: Secondary | ICD-10-CM | POA: Diagnosis not present

## 2014-02-22 DIAGNOSIS — Z8739 Personal history of other diseases of the musculoskeletal system and connective tissue: Secondary | ICD-10-CM | POA: Insufficient documentation

## 2014-02-22 DIAGNOSIS — H409 Unspecified glaucoma: Secondary | ICD-10-CM | POA: Insufficient documentation

## 2014-02-22 DIAGNOSIS — Z8639 Personal history of other endocrine, nutritional and metabolic disease: Secondary | ICD-10-CM | POA: Diagnosis not present

## 2014-02-22 DIAGNOSIS — Z8719 Personal history of other diseases of the digestive system: Secondary | ICD-10-CM | POA: Insufficient documentation

## 2014-02-22 DIAGNOSIS — Z85828 Personal history of other malignant neoplasm of skin: Secondary | ICD-10-CM | POA: Insufficient documentation

## 2014-02-22 NOTE — Discharge Instructions (Signed)

## 2014-02-22 NOTE — ED Notes (Addendum)
Patient here requesting evaluation for nosebleed x 2 yesterday, none on arrival. Reports first episode from left nare yesterday afternoon and had bleeding from left nare during the night, denies trauma

## 2014-02-22 NOTE — ED Provider Notes (Signed)
CSN: [id number]     Arrival date & time 02/22/14  1030 History   First MD Initiated Contact with Patient 02/22/14 1114     Chief Complaint  Patient presents with  . Epistaxis     (Consider location/radiation/quality/duration/timing/severity/associated sxs/prior Treatment) HPI  80 year old female presents after having epistaxis twice yesterday. The second time was close to bedtime. When she woke up this morning she produced sputum as she does every morning. There was a little bit of blood in her sputum. She states this is not specific a cough. She's not had any cough, shortness of breath, chest pain, or lightheadedness. She is not on any blood thinners. The nosebleeds stopped after direct pressure. She is not normally have nosebleeds. Patient is not had any hemoptysis or blood since the one time this morning.  Past Medical History  Diagnosis Date  . Menopause   . Lumbar herniated disc   . Arthritis   . Skin cancer   . Glaucoma   . Urge incontinence   . Diverticulosis     colonoscopy 2007  . HLD (hyperlipidemia)    Past Surgical History  Procedure Laterality Date  . Abdominal hysterectomy  1985  . Fixation kyphoplasty lumbar spine  2005  . Inguinal hernia repair  1947    right  . Appendectomy  1947  . Cataract extraction      bilateral  . Inguinal hernia repair  1971    left   Family History  Problem Relation Age of Onset  . Heart failure Mother   . Diabetes Mother   . Heart failure Father   . Diabetes Father   . Breast cancer Sister   . Diabetes Sister    History  Substance Use Topics  . Smoking status: Never Smoker   . Smokeless tobacco: Never Used  . Alcohol Use: 1.2 oz/week    2 Glasses of wine per week   OB History    Gravida Para Term Preterm AB TAB SAB Ectopic Multiple Living   4 4             Review of Systems  Constitutional: Negative for fatigue.  HENT: Positive for nosebleeds.   Respiratory: Negative for cough and shortness of breath.    Cardiovascular: Negative for chest pain.  Gastrointestinal: Negative for vomiting.  Neurological: Negative for weakness and numbness.      Allergies  Statins  Home Medications   Prior to Admission medications   Medication Sig Start Date End Date Taking? Authorizing Provider  CELEBREX 200 MG capsule TAKE 1 CAPSULE TWICE DAILY 03/24/13  Yes Lanice Shirts, MD  fexofenadine (ALLEGRA) 180 MG tablet Take 180 mg by mouth daily.   Yes Historical Provider, MD  Carboxymethylcell-Hypromellose (GENTEAL) 0.25-0.3 % GEL Apply to eye at bedtime.      Historical Provider, MD  ciclopirox (PENLAC) 8 % solution Apply topically at bedtime. Apply over nail and surrounding skin. Apply daily over previous coat. After seven (7) days, may remove with alcohol and continue cycle. 02/20/12   Lanice Shirts, MD  Hypromellose (GENTEAL) 0.3 % SOLN Apply 1 drop to eye as needed.     Historical Provider, MD  Misc Natural Products (OSTEO BI-FLEX JOINT SHIELD PO) Take 1 tablet by mouth daily.    Historical Provider, MD  Multiple Vitamins-Minerals (CENTRUM SILVER PO) Take 1 tablet by mouth daily.      Historical Provider, MD  omeprazole (PRILOSEC) 20 MG capsule TAKE 1 CAPSULE DAILY    Altamese Cabal Schoenhoff,  MD  polyethylene glycol powder (GLYCOLAX/MIRALAX) powder MIX 17 GRAMS (1 TABLESPOONFUL) IN LIQUID AND DRINK DAILY 11/12/12   Lanice Shirts, MD  polyethylene glycol powder [facility]) powder Take as directed on bottle 12/26/12   Lanice Shirts, MD  timolol (BETIMOL) 0.5 % ophthalmic solution Place 1 drop into both eyes daily.      Historical Provider, MD  traMADol (ULTRAM) 50 MG tablet Take 1 tablet (50 mg total) by mouth every 8 (eight) hours as needed. 05/17/12   Lanice Shirts, MD   BP 138/88 mmHg  Pulse 85  Temp(Src) 97.8 F (36.6 C) (Oral)  Resp 18  Ht 5\' 6"  (1.676 m)  Wt 164 lb (74.39 kg)  BMI 26.48 kg/m2  SpO2 98% Physical Exam  Constitutional: She is oriented to  person, place, and time. She appears well-developed and well-nourished.  HENT:  Head: Normocephalic and atraumatic.  Right Ear: External ear normal.  Left Ear: External ear normal.  Nose: Nose normal. No nasal deformity, septal deviation or nasal septal hematoma. No epistaxis.  No foreign bodies.  Mouth/Throat: Oropharynx is clear and moist.  No signs of acute nose bleed or coagulation/injury  Eyes: Right eye exhibits no discharge. Left eye exhibits no discharge.  Neck: Neck supple.  Cardiovascular: Normal rate, regular rhythm and normal heart sounds.   Pulmonary/Chest: Effort normal and breath sounds normal.  Abdominal: She exhibits no distension.  Neurological: She is alert and oriented to person, place, and time.  Skin: Skin is warm and dry.  Nursing note and vitals reviewed.   ED Course  Procedures (including critical care time) Labs Review Labs Reviewed - No data to display  Imaging Review No results found.   EKG Interpretation None      MDM   Final diagnoses:  Epistaxis    Patient's symptoms appear to be from the residual blood from her epistaxis last night. A believe this mixed with her sputum that she normally coughs up each morning. She's not having any signs of cough at this time this is not seem to be hemoptysis or hematemesis. She's quite well appearing and is otherwise asymptomatic. She seems mostly worried because she is associating nosebleeds with hypertension, although she did not check her blood pressure. She went to make sure that she was "all right". At this point she has a normal exam, finishing follow-up with her PCP as needed.    Ephraim Hamburger, MD 02/22/14 [phone]

## 2014-03-03 DIAGNOSIS — I10 Essential (primary) hypertension: Secondary | ICD-10-CM | POA: Insufficient documentation

## 2014-03-06 DIAGNOSIS — F09 Unspecified mental disorder due to known physiological condition: Secondary | ICD-10-CM | POA: Insufficient documentation

## 2014-03-07 ENCOUNTER — Other Ambulatory Visit: Payer: Self-pay | Admitting: Internal Medicine

## 2014-03-09 NOTE — Telephone Encounter (Signed)
Refill request

## 2014-04-28 DIAGNOSIS — N390 Urinary tract infection, site not specified: Secondary | ICD-10-CM | POA: Insufficient documentation

## 2014-06-03 ENCOUNTER — Ambulatory Visit (HOSPITAL_BASED_OUTPATIENT_CLINIC_OR_DEPARTMENT_OTHER)
Admission: RE | Admit: 2014-06-03 | Discharge: 2014-06-03 | Disposition: A | Discharge status: Home / Self Care | Payer: Medicare Other | Source: Ambulatory Visit | Attending: Family Medicine | Admitting: Family Medicine

## 2014-06-03 ENCOUNTER — Ambulatory Visit (INDEPENDENT_AMBULATORY_CARE_PROVIDER_SITE_OTHER): Payer: Medicare Other | Admitting: Family Medicine

## 2014-06-03 ENCOUNTER — Encounter: Payer: Self-pay | Admitting: Family Medicine

## 2014-06-03 VITALS — BP 160/91 | HR 73 | Ht 67.0 in | Wt 164.0 lb

## 2014-06-03 DIAGNOSIS — M79604 Pain in right leg: Secondary | ICD-10-CM | POA: Diagnosis not present

## 2014-06-03 DIAGNOSIS — M179 Osteoarthritis of knee, unspecified: Secondary | ICD-10-CM | POA: Diagnosis not present

## 2014-06-03 DIAGNOSIS — M25561 Pain in right knee: Secondary | ICD-10-CM | POA: Diagnosis present

## 2014-06-03 DIAGNOSIS — M11261 Other chondrocalcinosis, right knee: Secondary | ICD-10-CM | POA: Insufficient documentation

## 2014-06-03 DIAGNOSIS — M25551 Pain in right hip: Secondary | ICD-10-CM | POA: Insufficient documentation

## 2014-06-03 NOTE — Patient Instructions (Signed)
Your x-rays are reassuring though you have arthritis (we knew this) and the proximal thigh pain is due to muscle spasms. Compression sleeve when up and walking around. Heat 15 minutes at a time 3-4 times a day. Tylenol for pain. Ibuprofen or aleve as needed. Standing hip rotations, straight leg raises, knee extensions all 3 sets of 10 once a day - can add ankle weight if these are too easy. Consider cortisone injection for your knee. Follow up with me in 1 month.

## 2014-06-08 NOTE — Progress Notes (Signed)
Patient ID: Brianna Gregory, female   DOB: [birth year], 81 y.o.   MRN: [id number]  PCP: Kelton Pillar, MD  Subjective:   HPI: Patient is a 81 y.o. female here for right knee pain.  4/22: Patient reports she was doing wellness testing at [facility]. Around 11am during this she pushed up from chair, was on way around a cone when it felt like her right knee buckled. Did not fall down. Did not hyperextend or obviously twist it that she noticed. Caused pain lateral and posterior knee. Has old radiographs that showed arthritis, chondrocalcinosis. No catching or locking.  5/27: Patient reports pain still present in knee, about 6/10 level. Some swelling. Has been icing, taking ibuprofen and tramadol as needed. No new injuries.  7/20: Patient reports she is doing very well. Takes tylenol as needed. Only had two times where knee was sore - once with water aerobics and another with bocce. Twinge when pushing on lateral aspect of knee when this did hurt. Injection helped. Has been water walking as well. No current complaints.  8/20: Patient reports she has had some soreness in quad and lateral right thigh. Doing home exercises even though these make her a little more sore. No redness or warmth. No new injuries. No pain in knee.  06/03/14: Patient reports right knee has started acting up again. Pain up to 6/10 level. Some proximal quad soreness as well - has been using rolling pin but this hurts worse. Painful to walk. + swelling. Would like x-rays. No catching, locking, giving out. No new injuries.  Past Medical History  Diagnosis Date  . Menopause   . Lumbar herniated disc   . Arthritis   . Skin cancer   . Glaucoma   . Urge incontinence   . Diverticulosis     colonoscopy 2007  . HLD (hyperlipidemia)     Current Outpatient Prescriptions on File Prior to Visit  Medication Sig Dispense Refill  . Carboxymethylcell-Hypromellose (GENTEAL) 0.25-0.3 % GEL Apply to  eye at bedtime.      . CELEBREX 200 MG capsule TAKE 1 CAPSULE TWICE DAILY 180 capsule 0  . ciclopirox (PENLAC) 8 % solution Apply topically at bedtime. Apply over nail and surrounding skin. Apply daily over previous coat. After seven (7) days, may remove with alcohol and continue cycle. 6.6 mL 0  . fexofenadine (ALLEGRA) 180 MG tablet Take 180 mg by mouth daily.    . Hypromellose (GENTEAL) 0.3 % SOLN Apply 1 drop to eye as needed.     . Misc Natural Products (OSTEO BI-FLEX JOINT SHIELD PO) Take 1 tablet by mouth daily.    . Multiple Vitamins-Minerals (CENTRUM SILVER PO) Take 1 tablet by mouth daily.      Marland Kitchen omeprazole (PRILOSEC) 20 MG capsule TAKE 1 CAPSULE DAILY 90 capsule 2  . polyethylene glycol powder (GLYCOLAX/MIRALAX) powder MIX 17 GRAMS (1 TABLESPOONFUL) IN LIQUID AND DRINK DAILY 119 g 1  . polyethylene glycol powder (GLYCOLAX/MIRALAX) powder Take as directed on bottle 255 g 0  . timolol (BETIMOL) 0.5 % ophthalmic solution Place 1 drop into both eyes daily.      . traMADol (ULTRAM) 50 MG tablet Take 1 tablet (50 mg total) by mouth every 8 (eight) hours as needed. 90 tablet 3   No current facility-administered medications on file prior to visit.    Past Surgical History  Procedure Laterality Date  . Abdominal hysterectomy  1985  . Fixation kyphoplasty lumbar spine  2005  . Inguinal hernia repair  [phone]  right  . Appendectomy  1947  . Cataract extraction      bilateral  . Inguinal hernia repair  1971    left    Allergies  Allergen Reactions  . Statins Other (See Comments)    myalgias    History   Social History  . Marital Status: Married    Spouse Name: N/A  . Number of Children: 4  . Years of Education: N/A   Occupational History  . retired Marine scientist    Social History Main Topics  . Smoking status: Never Smoker   . Smokeless tobacco: Never Used  . Alcohol Use: 1.2 oz/week    2 Glasses of wine per week  . Drug Use: No  . Sexual Activity: Not Currently   Other  Topics Concern  . Not on file   Social History Narrative    Family History  Problem Relation Age of Onset  . Heart failure Mother   . Diabetes Mother   . Heart failure Father   . Diabetes Father   . Breast cancer Sister   . Diabetes Sister     BP 160/91 mmHg  Pulse 73  Ht 5\' 7"  (1.702 m)  Wt 164 lb (74.39 kg)  BMI 25.68 kg/m2  Review of Systems: See HPI above.    Objective:  Physical Exam:  Gen: NAD  Right knee: No gross deformity, ecchymoses, effusion. No lateral or medial joint line tenderness.  TTP mid-proximal anterior quad. No IT band tenderness. FROM. Negative ant/post drawers. Negative valgus/varus testing. Negative lachmanns. Negative mcmurrays, apleys, patellar apprehension. NV intact distally.    Assessment & Plan:  1. Right knee pain - radiographs show DJD - pain due to this and quad spasms.  Heat, compression sleeve, tylenol, nsaids as needed.  Shown home exercises to do daily.  Consider intraarticular injection in future.  F/u in 1 month.

## 2014-06-08 NOTE — Assessment & Plan Note (Signed)
radiographs show DJD - pain due to this and quad spasms.  Heat, compression sleeve, tylenol, nsaids as needed.  Shown home exercises to do daily.  Consider intraarticular injection in future.  F/u in 1 month.

## 2014-07-03 ENCOUNTER — Ambulatory Visit (INDEPENDENT_AMBULATORY_CARE_PROVIDER_SITE_OTHER): Payer: Medicare Other | Admitting: Family Medicine

## 2014-07-03 ENCOUNTER — Encounter: Payer: Self-pay | Admitting: Family Medicine

## 2014-07-03 VITALS — BP 140/84 | HR 60 | Ht 65.0 in | Wt 165.0 lb

## 2014-07-03 DIAGNOSIS — M25561 Pain in right knee: Secondary | ICD-10-CM | POA: Diagnosis present

## 2014-07-03 DIAGNOSIS — M79604 Pain in right leg: Secondary | ICD-10-CM | POA: Diagnosis not present

## 2014-07-03 NOTE — Patient Instructions (Signed)
Call me if your knee pain worsens and you want to repeat a cortisone shot - I wouldn't do one of these today though. Wear the thigh sleeve with long walking otherwise you don't need to use it at this point.

## 2014-07-07 NOTE — Progress Notes (Signed)
Patient ID: Brianna Gregory, female   DOB: 02-24-1927, 81 y.o.   MRN: [id number]  PCP: Kelton Pillar, MD  Subjective:   HPI: Patient is a 81 y.o. female here for right knee pain.  4/22: Patient reports she was doing wellness testing at [facility]. Around 11am during this she pushed up from chair, was on way around a cone when it felt like her right knee buckled. Did not fall down. Did not hyperextend or obviously twist it that she noticed. Caused pain lateral and posterior knee. Has old radiographs that showed arthritis, chondrocalcinosis. No catching or locking.  5/27: Patient reports pain still present in knee, about 6/10 level. Some swelling. Has been icing, taking ibuprofen and tramadol as needed. No new injuries.  7/20: Patient reports she is doing very well. Takes tylenol as needed. Only had two times where knee was sore - once with water aerobics and another with bocce. Twinge when pushing on lateral aspect of knee when this did hurt. Injection helped. Has been water walking as well. No current complaints.  8/20: Patient reports she has had some soreness in quad and lateral right thigh. Doing home exercises even though these make her a little more sore. No redness or warmth. No new injuries. No pain in knee.  06/03/14: Patient reports right knee has started acting up again. Pain up to 6/10 level. Some proximal quad soreness as well - has been using rolling pin but this hurts worse. Painful to walk. + swelling. Would like x-rays. No catching, locking, giving out. No new injuries.  4/8: Patient reports her knee feels better. Wearing quad sleeve which helps. Still gets pain occasionally but none now. Had some pain yesterday. Slight swelling. No catching, locking, giving out.  Past Medical History  Diagnosis Date  . Menopause   . Lumbar herniated disc   . Arthritis   . Skin cancer   . Glaucoma   . Urge incontinence   . Diverticulosis    colonoscopy 2007  . HLD (hyperlipidemia)     Current Outpatient Prescriptions on File Prior to Visit  Medication Sig Dispense Refill  . Carboxymethylcell-Hypromellose (GENTEAL) 0.25-0.3 % GEL Apply to eye at bedtime.      . CELEBREX 200 MG capsule TAKE 1 CAPSULE TWICE DAILY 180 capsule 0  . ciclopirox (PENLAC) 8 % solution Apply topically at bedtime. Apply over nail and surrounding skin. Apply daily over previous coat. After seven (7) days, may remove with alcohol and continue cycle. 6.6 mL 0  . fexofenadine (ALLEGRA) 180 MG tablet Take 180 mg by mouth daily.    . Hypromellose (GENTEAL) 0.3 % SOLN Apply 1 drop to eye as needed.     . Misc Natural Products (OSTEO BI-FLEX JOINT SHIELD PO) Take 1 tablet by mouth daily.    . Multiple Vitamins-Minerals (CENTRUM SILVER PO) Take 1 tablet by mouth daily.      Marland Kitchen omeprazole (PRILOSEC) 20 MG capsule TAKE 1 CAPSULE DAILY 90 capsule 2  . polyethylene glycol powder (GLYCOLAX/MIRALAX) powder Take as directed on bottle 255 g 0  . timolol (BETIMOL) 0.5 % ophthalmic solution Place 1 drop into both eyes daily.      . traMADol (ULTRAM) 50 MG tablet Take 1 tablet (50 mg total) by mouth every 8 (eight) hours as needed. 90 tablet 3   No current facility-administered medications on file prior to visit.    Past Surgical History  Procedure Laterality Date  . Abdominal hysterectomy  1985  . Fixation kyphoplasty lumbar spine  2005  . Inguinal hernia repair  1947    right  . Appendectomy  1947  . Cataract extraction      bilateral  . Inguinal hernia repair  1971    left    Allergies  Allergen Reactions  . Statins Other (See Comments)    myalgias    History   Social History  . Marital Status: Married    Spouse Name: N/A  . Number of Children: 4  . Years of Education: N/A   Occupational History  . retired Marine scientist    Social History Main Topics  . Smoking status: Never Smoker   . Smokeless tobacco: Never Used  . Alcohol Use: 1.2 oz/week    2  Glasses of wine per week  . Drug Use: No  . Sexual Activity: Not Currently   Other Topics Concern  . Not on file   Social History Narrative    Family History  Problem Relation Age of Onset  . Heart failure Mother   . Diabetes Mother   . Heart failure Father   . Diabetes Father   . Breast cancer Sister   . Diabetes Sister     BP 140/84 mmHg  Pulse 60  Ht 5\' 5"  (1.651 m)  Wt 165 lb (74.844 kg)  BMI 27.46 kg/m2  Review of Systems: See HPI above.    Objective:  Physical Exam:  Gen: NAD  Right knee: No gross deformity, ecchymoses, effusion. No lateral or medial joint line tenderness. FROM. Negative ant/post drawers. Negative valgus/varus testing. Negative lachmanns. Negative mcmurrays, apleys, patellar apprehension. NV intact distally.    Assessment & Plan:  1. Right knee pain - radiographs show DJD - pain due to this and quad spasms.  Improved.  Continue with heat, compression sleeve, tylenol, nsaids as needed.  Shown home exercises to do daily - can back down to 2-3 times a week now.  Consider intraarticular injection in future.  F/u prn.

## 2014-07-07 NOTE — Assessment & Plan Note (Signed)
radiographs show DJD - pain due to this and quad spasms.  Improved.  Continue with heat, compression sleeve, tylenol, nsaids as needed.  Shown home exercises to do daily - can back down to 2-3 times a week now.  Consider intraarticular injection in future.  F/u prn.

## 2014-07-24 ENCOUNTER — Ambulatory Visit (INDEPENDENT_AMBULATORY_CARE_PROVIDER_SITE_OTHER): Payer: Medicare Other | Admitting: Family Medicine

## 2014-07-24 ENCOUNTER — Encounter: Payer: Self-pay | Admitting: Family Medicine

## 2014-07-24 VITALS — BP 106/79 | HR 84 | Ht 65.0 in | Wt 162.0 lb

## 2014-07-24 DIAGNOSIS — M79604 Pain in right leg: Secondary | ICD-10-CM | POA: Diagnosis not present

## 2014-07-24 DIAGNOSIS — M25561 Pain in right knee: Secondary | ICD-10-CM | POA: Diagnosis present

## 2014-07-24 MED ORDER — METHYLPREDNISOLONE ACETATE 40 MG/ML IJ SUSP
40.0000 mg | Freq: Once | INTRAMUSCULAR | Status: AC
  Administered 2014-07-24: 40 mg via INTRA_ARTICULAR

## 2014-07-28 NOTE — Progress Notes (Signed)
Patient ID: Brianna Gregory, female   DOB: 05/23/1926, 81 y.o.   MRN: [id number]  PCP: Kelton Pillar, MD  Subjective:   HPI: Patient is a 81 y.o. female here for right knee pain.  4/22: Patient reports she was doing wellness testing at [facility]. Around 11am during this she pushed up from chair, was on way around a cone when it felt like her right knee buckled. Did not fall down. Did not hyperextend or obviously twist it that she noticed. Caused pain lateral and posterior knee. Has old radiographs that showed arthritis, chondrocalcinosis. No catching or locking.  5/27: Patient reports pain still present in knee, about 6/10 level. Some swelling. Has been icing, taking ibuprofen and tramadol as needed. No new injuries.  7/20: Patient reports she is doing very well. Takes tylenol as needed. Only had two times where knee was sore - once with water aerobics and another with bocce. Twinge when pushing on lateral aspect of knee when this did hurt. Injection helped. Has been water walking as well. No current complaints.  8/20: Patient reports she has had some soreness in quad and lateral right thigh. Doing home exercises even though these make her a little more sore. No redness or warmth. No new injuries. No pain in knee.  06/03/14: Patient reports right knee has started acting up again. Pain up to 6/10 level. Some proximal quad soreness as well - has been using rolling pin but this hurts worse. Painful to walk. + swelling. Would like x-rays. No catching, locking, giving out. No new injuries.  4/8: Patient reports her knee feels better. Wearing quad sleeve which helps. Still gets pain occasionally but none now. Had some pain yesterday. Slight swelling. No catching, locking, giving out.  4/29: Patient reports she's having lateral knee pain now. Minimal swelling. No catching, locking, giving out. No longer needing quad sleeve.  Past Medical History   Diagnosis Date  . Menopause   . Lumbar herniated disc   . Arthritis   . Skin cancer   . Glaucoma   . Urge incontinence   . Diverticulosis     colonoscopy 2007  . HLD (hyperlipidemia)     Current Outpatient Prescriptions on File Prior to Visit  Medication Sig Dispense Refill  . aspirin EC 81 MG tablet Take 81 mg by mouth.    . Carboxymethylcell-Hypromellose (GENTEAL) 0.25-0.3 % GEL Apply to eye at bedtime.      . CELEBREX 200 MG capsule TAKE 1 CAPSULE TWICE DAILY 180 capsule 0  . ciclopirox (PENLAC) 8 % solution Apply topically at bedtime. Apply over nail and surrounding skin. Apply daily over previous coat. After seven (7) days, may remove with alcohol and continue cycle. 6.6 mL 0  . fexofenadine (ALLEGRA) 180 MG tablet Take 180 mg by mouth daily.    . folic acid (FOLVITE) 1 MG tablet Take 1 mg by mouth.    . Hypromellose (GENTEAL) 0.3 % SOLN Apply 1 drop to eye as needed.     Marland Kitchen lisinopril (PRINIVIL,ZESTRIL) 10 MG tablet Take 10 mg by mouth.    . Misc Natural Products (OSTEO BI-FLEX JOINT SHIELD PO) Take 1 tablet by mouth daily.    . Multiple Vitamins-Minerals (CENTRUM SILVER PO) Take 1 tablet by mouth daily.      Marland Kitchen omeprazole (PRILOSEC) 20 MG capsule TAKE 1 CAPSULE DAILY 90 capsule 2  . polyethylene glycol powder (GLYCOLAX/MIRALAX) powder Take as directed on bottle 255 g 0  . rosuvastatin (CRESTOR) 10 MG tablet Take 10 mg  by mouth.    . timolol (BETIMOL) 0.5 % ophthalmic solution Place 1 drop into both eyes daily.      . traMADol (ULTRAM) 50 MG tablet Take 1 tablet (50 mg total) by mouth every 8 (eight) hours as needed. 90 tablet 3  . trimethoprim (TRIMPEX) 100 MG tablet Take 100 mg by mouth.     No current facility-administered medications on file prior to visit.    Past Surgical History  Procedure Laterality Date  . Abdominal hysterectomy  1985  . Fixation kyphoplasty lumbar spine  2005  . Inguinal hernia repair  1947    right  . Appendectomy  1947  . Cataract extraction       bilateral  . Inguinal hernia repair  1971    left    Allergies  Allergen Reactions  . Statins Other (See Comments)    myalgias    History   Social History  . Marital Status: Married    Spouse Name: N/A  . Number of Children: 4  . Years of Education: N/A   Occupational History  . retired Marine scientist    Social History Main Topics  . Smoking status: Never Smoker   . Smokeless tobacco: Never Used  . Alcohol Use: 1.2 oz/week    2 Glasses of wine per week  . Drug Use: No  . Sexual Activity: Not Currently   Other Topics Concern  . Not on file   Social History Narrative    Family History  Problem Relation Age of Onset  . Heart failure Mother   . Diabetes Mother   . Heart failure Father   . Diabetes Father   . Breast cancer Sister   . Diabetes Sister     BP 106/79 mmHg  Pulse 84  Ht 5\' 5"  (1.651 m)  Wt 162 lb (73.483 kg)  BMI 26.96 kg/m2  Review of Systems: See HPI above.    Objective:  Physical Exam:  Gen: NAD  Right knee: No gross deformity, ecchymoses, effusion. Distal IT band tenderness near insertion.  No joint line, other tenderness. FROM. Negative ant/post drawers. Negative valgus/varus testing. Negative lachmanns. Negative mcmurrays, apleys, patellar apprehension. NV intact distally.    Assessment & Plan:  1. Right knee pain - Patient has DJD but current exam consistent with distal IT band syndrome - shown exercises to do daily, stretches as well.  Given injection here today.    After informed written consent patient was seated on exam table.  Area overlying distal IT band near insertion prepped with alcohol swab then injected with 2:1 marcaine: depomedrol.  Patient tolerated procedure well without immediate complications.

## 2014-07-28 NOTE — Assessment & Plan Note (Signed)
Patient has DJD but current exam consistent with distal IT band syndrome - shown exercises to do daily, stretches as well.  Given injection here today.    After informed written consent patient was seated on exam table.  Area overlying distal IT band near insertion prepped with alcohol swab then injected with 2:1 marcaine: depomedrol.  Patient tolerated procedure well without immediate complications.

## 2014-09-23 ENCOUNTER — Encounter: Payer: Self-pay | Admitting: Family Medicine

## 2014-09-23 ENCOUNTER — Ambulatory Visit (INDEPENDENT_AMBULATORY_CARE_PROVIDER_SITE_OTHER): Payer: Medicare Other | Admitting: Family Medicine

## 2014-09-23 VITALS — BP 126/78 | HR 68 | Ht 66.0 in | Wt 159.0 lb

## 2014-09-23 DIAGNOSIS — M79675 Pain in left toe(s): Secondary | ICD-10-CM

## 2014-09-23 NOTE — Patient Instructions (Signed)
You have spasms of your flexor hallucis tendon in your foot. Use heat 15 minutes at a time 3-4 times a day. Ibuprofen or aleve if needed (if the tylenol isn't helping you enough). I expect this to be completely better over the next 2-3 days. If your knee or back worsens let me know otherwise follow-up as needed.

## 2014-09-24 DIAGNOSIS — M79675 Pain in left toe(s): Secondary | ICD-10-CM | POA: Insufficient documentation

## 2014-09-24 NOTE — Progress Notes (Signed)
PCP: SCHOENHOFF,DEBBIE, MD  Subjective:   HPI: Patient is a 81 y.o. female here for left toe pain.  Patient reports she woke up in middle of the night with plantar left great toe pain. No known injury. Thought she had a bug bite based on how it felt but none noted. Has improved since then - pain was at a 6/10.  Past Medical History  Diagnosis Date  . Menopause   . Lumbar herniated disc   . Arthritis   . Skin cancer   . Glaucoma   . Urge incontinence   . Diverticulosis     colonoscopy 2007  . HLD (hyperlipidemia)     Current Outpatient Prescriptions on File Prior to Visit  Medication Sig Dispense Refill  . aspirin EC 81 MG tablet Take 81 mg by mouth.    . Carboxymethylcell-Hypromellose (GENTEAL) 0.25-0.3 % GEL Apply to eye at bedtime.      . CELEBREX 200 MG capsule TAKE 1 CAPSULE TWICE DAILY 180 capsule 0  . ciclopirox (PENLAC) 8 % solution Apply topically at bedtime. Apply over nail and surrounding skin. Apply daily over previous coat. After seven (7) days, may remove with alcohol and continue cycle. 6.6 mL 0  . fexofenadine (ALLEGRA) 180 MG tablet Take 180 mg by mouth daily.    . folic acid (FOLVITE) 1 MG tablet Take 1 mg by mouth.    . Hypromellose (GENTEAL) 0.3 % SOLN Apply 1 drop to eye as needed.     Marland Kitchen lisinopril (PRINIVIL,ZESTRIL) 10 MG tablet Take 10 mg by mouth.    . Misc Natural Products (OSTEO BI-FLEX JOINT SHIELD PO) Take 1 tablet by mouth daily.    . Multiple Vitamins-Minerals (CENTRUM SILVER PO) Take 1 tablet by mouth daily.      Marland Kitchen omeprazole (PRILOSEC) 20 MG capsule TAKE 1 CAPSULE DAILY 90 capsule 2  . polyethylene glycol powder (GLYCOLAX/MIRALAX) powder Take as directed on bottle 255 g 0  . rosuvastatin (CRESTOR) 10 MG tablet Take 10 mg by mouth.    . timolol (BETIMOL) 0.5 % ophthalmic solution Place 1 drop into both eyes daily.      . traMADol (ULTRAM) 50 MG tablet Take 1 tablet (50 mg total) by mouth every 8 (eight) hours as needed. 90 tablet 3  .  trimethoprim (TRIMPEX) 100 MG tablet Take 100 mg by mouth.     No current facility-administered medications on file prior to visit.    Past Surgical History  Procedure Laterality Date  . Abdominal hysterectomy  1985  . Fixation kyphoplasty lumbar spine  2005  . Inguinal hernia repair  1947    right  . Appendectomy  1947  . Cataract extraction      bilateral  . Inguinal hernia repair  1971    left    Allergies  Allergen Reactions  . Statins Other (See Comments)    myalgias    History   Social History  . Marital Status: Married    Spouse Name: N/A  . Number of Children: 4  . Years of Education: N/A   Occupational History  . retired Marine scientist    Social History Main Topics  . Smoking status: Never Smoker   . Smokeless tobacco: Never Used  . Alcohol Use: 1.2 oz/week    2 Glasses of wine per week  . Drug Use: No  . Sexual Activity: Not Currently   Other Topics Concern  . Not on file   Social History Narrative    Family History  Problem Relation Age of Onset  . Heart failure Mother   . Diabetes Mother   . Heart failure Father   . Diabetes Father   . Breast cancer Sister   . Diabetes Sister     BP 126/78 mmHg  Pulse 68  Ht 5\' 6"  (1.676 m)  Wt 159 lb (72.122 kg)  BMI 25.68 kg/m2  Review of Systems: See HPI above.    Objective:  Physical Exam:  Gen: NAD  Left foot: No gross deformity, swelling, ecchymoses.  No skin punctures. FROM ankle, toes with mild pain on flexion, passive extension of great toe. No TTP NV intact distally.    Assessment & Plan:  1. Left flexor hallucis tendon spasms - reassured patient.  Heat, nsaids/tylenol if needed.  F/u prn.

## 2014-09-24 NOTE — Assessment & Plan Note (Signed)
Left flexor hallucis tendon spasms - reassured patient.  Heat, nsaids/tylenol if needed.  F/u prn.

## 2014-09-26 ENCOUNTER — Encounter (HOSPITAL_BASED_OUTPATIENT_CLINIC_OR_DEPARTMENT_OTHER): Payer: Self-pay | Admitting: *Deleted

## 2014-09-26 ENCOUNTER — Emergency Department (HOSPITAL_BASED_OUTPATIENT_CLINIC_OR_DEPARTMENT_OTHER)
Admission: EM | Admit: 2014-09-26 | Discharge: 2014-09-26 | Disposition: A | Discharge status: Home / Self Care | Payer: Medicare Other | Attending: Emergency Medicine | Admitting: Emergency Medicine

## 2014-09-26 DIAGNOSIS — Z8742 Personal history of other diseases of the female genital tract: Secondary | ICD-10-CM | POA: Diagnosis not present

## 2014-09-26 DIAGNOSIS — Z7982 Long term (current) use of aspirin: Secondary | ICD-10-CM | POA: Diagnosis not present

## 2014-09-26 DIAGNOSIS — M5432 Sciatica, left side: Secondary | ICD-10-CM | POA: Diagnosis not present

## 2014-09-26 DIAGNOSIS — E785 Hyperlipidemia, unspecified: Secondary | ICD-10-CM | POA: Insufficient documentation

## 2014-09-26 DIAGNOSIS — Z8669 Personal history of other diseases of the nervous system and sense organs: Secondary | ICD-10-CM | POA: Insufficient documentation

## 2014-09-26 DIAGNOSIS — Z79899 Other long term (current) drug therapy: Secondary | ICD-10-CM | POA: Insufficient documentation

## 2014-09-26 DIAGNOSIS — M1990 Unspecified osteoarthritis, unspecified site: Secondary | ICD-10-CM | POA: Insufficient documentation

## 2014-09-26 DIAGNOSIS — M79605 Pain in left leg: Secondary | ICD-10-CM | POA: Diagnosis present

## 2014-09-26 DIAGNOSIS — Z85828 Personal history of other malignant neoplasm of skin: Secondary | ICD-10-CM | POA: Insufficient documentation

## 2014-09-26 DIAGNOSIS — Z8719 Personal history of other diseases of the digestive system: Secondary | ICD-10-CM | POA: Insufficient documentation

## 2014-09-26 DIAGNOSIS — Z87448 Personal history of other diseases of urinary system: Secondary | ICD-10-CM | POA: Diagnosis not present

## 2014-09-26 NOTE — Discharge Instructions (Signed)
Sciatica Sciatica is pain, weakness, numbness, or tingling along the path of the sciatic nerve. The nerve starts in the lower back and runs down the back of each leg. The nerve controls the muscles in the lower leg and in the back of the knee, while also providing sensation to the back of the thigh, lower leg, and the sole of your foot. Sciatica is a symptom of another medical condition. For instance, nerve damage or certain conditions, such as a herniated disk or bone spur on the spine, pinch or put pressure on the sciatic nerve. This causes the pain, weakness, or other sensations normally associated with sciatica. Generally, sciatica only affects one side of the body. CAUSES   Herniated or slipped disc.  Degenerative disk disease.  A pain disorder involving the narrow muscle in the buttocks (piriformis syndrome).  Pelvic injury or fracture.  Pregnancy.  Tumor (rare). SYMPTOMS  Symptoms can vary from mild to very severe. The symptoms usually travel from the low back to the buttocks and down the back of the leg. Symptoms can include:  Mild tingling or dull aches in the lower back, leg, or hip.  Numbness in the back of the calf or sole of the foot.  Burning sensations in the lower back, leg, or hip.  Sharp pains in the lower back, leg, or hip.  Leg weakness.  Severe back pain inhibiting movement. These symptoms may get worse with coughing, sneezing, laughing, or prolonged sitting or standing. Also, being overweight may worsen symptoms. DIAGNOSIS  Your caregiver will perform a physical exam to look for common symptoms of sciatica. He or she may ask you to do certain movements or activities that would trigger sciatic nerve pain. Other tests may be performed to find the cause of the sciatica. These may include:  Blood tests.  X-rays.  Imaging tests, such as an MRI or CT scan. TREATMENT  Treatment is directed at the cause of the sciatic pain. Sometimes, treatment is not necessary  and the pain and discomfort goes away on its own. If treatment is needed, your caregiver may suggest:  Over-the-counter medicines to relieve pain.  Prescription medicines, such as anti-inflammatory medicine, muscle relaxants, or narcotics.  Applying heat or ice to the painful area.  Steroid injections to lessen pain, irritation, and inflammation around the nerve.  Reducing activity during periods of pain.  Exercising and stretching to strengthen your abdomen and improve flexibility of your spine. Your caregiver may suggest losing weight if the extra weight makes the back pain worse.  Physical therapy.  Surgery to eliminate what is pressing or pinching the nerve, such as a bone spur or part of a herniated disk. HOME CARE INSTRUCTIONS   Only take over-the-counter or prescription medicines for pain or discomfort as directed by your caregiver.  Apply ice to the affected area for 20 minutes, 3-4 times a day for the first 48-72 hours. Then try heat in the same way.  Exercise, stretch, or perform your usual activities if these do not aggravate your pain.  Attend physical therapy sessions as directed by your caregiver.  Keep all follow-up appointments as directed by your caregiver.  Do not wear high heels or shoes that do not provide proper support.  Check your mattress to see if it is too soft. A firm mattress may lessen your pain and discomfort. SEEK IMMEDIATE MEDICAL CARE IF:   You lose control of your bowel or bladder (incontinence).  You have increasing weakness in the lower back, pelvis, buttocks,   or legs.  You have redness or swelling of your back.  You have a burning sensation when you urinate.  You have pain that gets worse when you lie down or awakens you at night.  Your pain is worse than you have experienced in the past.  Your pain is lasting longer than 4 weeks.  You are suddenly losing weight without reason. MAKE SURE YOU:  Understand these  instructions.  Will watch your condition.  Will get help right away if you are not doing well or get worse. Document Released: 03/07/2001 Document Revised: 09/12/2011 Document Reviewed: 07/23/2011 Exit[employer] Patient Information 2015 [employer]. This information is not intended to replace advice given to you by your health care provider. Make sure you discuss any questions you have with your health care provider.  

## 2014-09-26 NOTE — ED Provider Notes (Signed)
CSN: [id number]     Arrival date & time 09/26/14  0729 History   First MD Initiated Contact with Patient 09/26/14 [phone]     Chief Complaint  Patient presents with  . Leg Pain   HPI Pt presents to the ED for follow up on pain in her leg and toe.  Pt developed pain in her left big toe that started a few days ago when she woke up.  She was seen in the office by Dr Barbaraann Barthel this week.  Pt was diagnosed with a tendonitis.  She was instructed to follow up with him if she started to develop pain in her knee or leg.  This morning she noticed pain in her upper thigh traveling down her knee to her toe.  Because of these symptoms and the instructions she came to the ED.  Pt denies any trouble with numbness or weakness.  No fevers or chills. Past Medical History  Diagnosis Date  . Menopause   . Lumbar herniated disc   . Arthritis   . Skin cancer   . Glaucoma   . Urge incontinence   . Diverticulosis     colonoscopy 2007  . HLD (hyperlipidemia)    Past Surgical History  Procedure Laterality Date  . Abdominal hysterectomy  1985  . Fixation kyphoplasty lumbar spine  2005  . Inguinal hernia repair  1947    right  . Appendectomy  1947  . Cataract extraction      bilateral  . Inguinal hernia repair  1971    left   Family History  Problem Relation Age of Onset  . Heart failure Mother   . Diabetes Mother   . Heart failure Father   . Diabetes Father   . Breast cancer Sister   . Diabetes Sister    History  Substance Use Topics  . Smoking status: Never Smoker   . Smokeless tobacco: Never Used  . Alcohol Use: 1.2 oz/week    2 Glasses of wine per week   OB History    Gravida Para Term Preterm AB TAB SAB Ectopic Multiple Living   4 4             Review of Systems  All other systems reviewed and are negative.     Allergies  Statins  Home Medications   Prior to Admission medications   Medication Sig Start Date End Date Taking? Authorizing Provider  aspirin EC 81 MG tablet Take 81 mg  by mouth.    Historical Provider, MD  Carboxymethylcell-Hypromellose (GENTEAL) 0.25-0.3 % GEL Apply to eye at bedtime.      Historical Provider, MD  CELEBREX 200 MG capsule TAKE 1 CAPSULE TWICE DAILY 03/24/13   Lanice Shirts, MD  ciclopirox (PENLAC) 8 % solution Apply topically at bedtime. Apply over nail and surrounding skin. Apply daily over previous coat. After seven (7) days, may remove with alcohol and continue cycle. 02/20/12   Lanice Shirts, MD  fexofenadine (ALLEGRA) 180 MG tablet Take 180 mg by mouth daily.    Historical Provider, MD  folic acid (FOLVITE) 1 MG tablet Take 1 mg by mouth.    Historical Provider, MD  Hypromellose (GENTEAL) 0.3 % SOLN Apply 1 drop to eye as needed.     Historical Provider, MD  lisinopril (PRINIVIL,ZESTRIL) 10 MG tablet Take 10 mg by mouth. 06/09/14 06/09/15  Historical Provider, MD  Misc Natural Products (OSTEO BI-FLEX JOINT SHIELD PO) Take 1 tablet by mouth daily.    Historical  Provider, MD  Multiple Vitamins-Minerals (CENTRUM SILVER PO) Take 1 tablet by mouth daily.      Historical Provider, MD  omeprazole (PRILOSEC) 20 MG capsule TAKE 1 CAPSULE DAILY 03/10/14   Lanice Shirts, MD  polyethylene glycol powder [facility]) powder Take as directed on bottle 12/26/12   Lanice Shirts, MD  rosuvastatin (CRESTOR) 10 MG tablet Take 10 mg by mouth. 06/09/14 06/09/15  Historical Provider, MD  timolol (BETIMOL) 0.5 % ophthalmic solution Place 1 drop into both eyes daily.      Historical Provider, MD  traMADol (ULTRAM) 50 MG tablet Take 1 tablet (50 mg total) by mouth every 8 (eight) hours as needed. 05/17/12   Lanice Shirts, MD  trimethoprim (TRIMPEX) 100 MG tablet Take 100 mg by mouth.    Historical Provider, MD   BP 124/86 mmHg  Pulse 94  Temp(Src) 97.9 F (36.6 C) (Oral)  Resp 16  Ht 5\' 5"  (1.651 m)  Wt 160 lb (72.576 kg)  BMI 26.63 kg/m2  SpO2 97% Physical Exam  Constitutional: She appears well-developed and  well-nourished. No distress.  HENT:  Head: Normocephalic and atraumatic.  Right Ear: External ear normal.  Left Ear: External ear normal.  Eyes: Conjunctivae are normal. Right eye exhibits no discharge. Left eye exhibits no discharge. No scleral icterus.  Neck: Neck supple. No tracheal deviation present.  Cardiovascular: Normal rate.   Pulmonary/Chest: Effort normal. No stridor. No respiratory distress.  Musculoskeletal: She exhibits no edema or tenderness.       Left knee: Normal.       Left ankle: Normal.       Left foot: Normal.  Neurological: She is alert. Cranial nerve deficit: no gross deficits.  Normal leg strength, normal sensation   Skin: Skin is warm and dry. No rash noted.  Psychiatric: She has a normal mood and affect.  Nursing note and vitals reviewed.   ED Course  Procedures (including critical care time)   MDM   Final diagnoses:  Sciatica, left    She is most likely having mild sciatica.  Pain is not severe.  She has pain medications at home that she will use.  Cautioned her about taking celebrex and other nsaids as she had been doing.  Pt is familiar with sciatica treatment. Follow up with Dr Barbaraann Barthel next week.    Dorie Rank, MD 09/26/14 [phone]

## 2014-09-26 NOTE — ED Notes (Signed)
Patient c/o shooting pain that radiates from hip down her left leg

## 2014-09-29 ENCOUNTER — Encounter: Payer: Self-pay | Admitting: Family Medicine

## 2014-09-29 ENCOUNTER — Ambulatory Visit (INDEPENDENT_AMBULATORY_CARE_PROVIDER_SITE_OTHER): Payer: Medicare Other | Admitting: Family Medicine

## 2014-09-29 VITALS — BP 107/72 | HR 103 | Ht 65.0 in | Wt 159.0 lb

## 2014-09-29 DIAGNOSIS — M25562 Pain in left knee: Secondary | ICD-10-CM | POA: Diagnosis present

## 2014-09-29 MED ORDER — METHYLPREDNISOLONE ACETATE 40 MG/ML IJ SUSP
40.0000 mg | Freq: Once | INTRAMUSCULAR | Status: AC
  Administered 2014-09-29: 40 mg via INTRA_ARTICULAR

## 2014-09-30 DIAGNOSIS — M25562 Pain in left knee: Secondary | ICD-10-CM | POA: Insufficient documentation

## 2014-09-30 NOTE — Assessment & Plan Note (Signed)
2/2 DJD though discussed this may be referred pain from her back (appointment made to get in with her back specialist).  Discussed tylenol, tramadol, glucosamine.  Injection given today per patient request.  After informed written consent, patient was seated on exam table. Left knee was prepped with alcohol swab and utilizing anteromedial approach, patient's left knee was injected intraarticularly with 3:1 marcaine: depomedrol. Patient tolerated the procedure well without immediate complications.

## 2014-09-30 NOTE — Progress Notes (Signed)
PCP: SCHOENHOFF,DEBBIE, MD  Subjective:   HPI: Patient is a 81 y.o. female here for left knee pain, back pain.  Patient came in today partially to discuss back pain and get a referral to her back specialist but she was seen there 2 weeks ago and it appears she does not need a new referral to go back there. Pain in left side of back 5/10 level with radiation into left calf that comes and goes. Was bad enough for her to visit the ED. She is interested in a possible injection. Radiographs done without new compression fracture at her last visit with spine specialist. Left knee pain up to 8/10 - interested in trying an injection for this side.  Right knee no longer hurting her. No catching, locking, giving out. No new injuries here either.  Past Medical History  Diagnosis Date  . Menopause   . Lumbar herniated disc   . Arthritis   . Skin cancer   . Glaucoma   . Urge incontinence   . Diverticulosis     colonoscopy 2007  . HLD (hyperlipidemia)     Current Outpatient Prescriptions on File Prior to Visit  Medication Sig Dispense Refill  . aspirin EC 81 MG tablet Take 81 mg by mouth.    . Carboxymethylcell-Hypromellose (GENTEAL) 0.25-0.3 % GEL Apply to eye at bedtime.      . CELEBREX 200 MG capsule TAKE 1 CAPSULE TWICE DAILY 180 capsule 0  . ciclopirox (PENLAC) 8 % solution Apply topically at bedtime. Apply over nail and surrounding skin. Apply daily over previous coat. After seven (7) days, may remove with alcohol and continue cycle. 6.6 mL 0  . fexofenadine (ALLEGRA) 180 MG tablet Take 180 mg by mouth daily.    . folic acid (FOLVITE) 1 MG tablet Take 1 mg by mouth.    . Hypromellose (GENTEAL) 0.3 % SOLN Apply 1 drop to eye as needed.     Marland Kitchen lisinopril (PRINIVIL,ZESTRIL) 10 MG tablet Take 10 mg by mouth.    . Misc Natural Products (OSTEO BI-FLEX JOINT SHIELD PO) Take 1 tablet by mouth daily.    . Multiple Vitamins-Minerals (CENTRUM SILVER PO) Take 1 tablet by mouth daily.      Marland Kitchen  omeprazole (PRILOSEC) 20 MG capsule TAKE 1 CAPSULE DAILY 90 capsule 2  . polyethylene glycol powder (GLYCOLAX/MIRALAX) powder Take as directed on bottle 255 g 0  . rosuvastatin (CRESTOR) 10 MG tablet Take 10 mg by mouth.    . timolol (BETIMOL) 0.5 % ophthalmic solution Place 1 drop into both eyes daily.      . traMADol (ULTRAM) 50 MG tablet Take 1 tablet (50 mg total) by mouth every 8 (eight) hours as needed. 90 tablet 3  . trimethoprim (TRIMPEX) 100 MG tablet Take 100 mg by mouth.     No current facility-administered medications on file prior to visit.    Past Surgical History  Procedure Laterality Date  . Abdominal hysterectomy  1985  . Fixation kyphoplasty lumbar spine  2005  . Inguinal hernia repair  1947    right  . Appendectomy  1947  . Cataract extraction      bilateral  . Inguinal hernia repair  1971    left    Allergies  Allergen Reactions  . Statins Other (See Comments)    myalgias    History   Social History  . Marital Status: Married    Spouse Name: N/A  . Number of Children: 4  . Years of Education:  N/A   Occupational History  . retired Marine scientist    Social History Main Topics  . Smoking status: Never Smoker   . Smokeless tobacco: Never Used  . Alcohol Use: 1.2 oz/week    2 Glasses of wine per week  . Drug Use: No  . Sexual Activity: Not Currently   Other Topics Concern  . Not on file   Social History Narrative    Family History  Problem Relation Age of Onset  . Heart failure Mother   . Diabetes Mother   . Heart failure Father   . Diabetes Father   . Breast cancer Sister   . Diabetes Sister     BP 107/72 mmHg  Pulse 103  Ht 5\' 5"  (1.651 m)  Wt 159 lb (72.122 kg)  BMI 26.46 kg/m2  Review of Systems: See HPI above.    Objective:  Physical Exam:  Gen: NAD  Left knee: No gross deformity, ecchymoses, swelling. Mild medial, lateral joint line tenderness. FROM. Negative ant/post drawers. Negative valgus/varus testing. Negative  lachmanns. Negative mcmurrays, apleys, patellar apprehension. NV intact distally.    Assessment & Plan:  1. Left knee pain - 2/2 DJD though discussed this may be referred pain from her back (appointment made to get in with her back specialist).  Discussed tylenol, tramadol, glucosamine.  Injection given today per patient request.  After informed written consent, patient was seated on exam table. Left knee was prepped with alcohol swab and utilizing anteromedial approach, patient's left knee was injected intraarticularly with 3:1 marcaine: depomedrol. Patient tolerated the procedure well without immediate complications.

## 2015-07-28 ENCOUNTER — Ambulatory Visit: Payer: Medicare Other | Admitting: Family Medicine

## 2015-08-10 ENCOUNTER — Ambulatory Visit: Payer: Medicare Other | Admitting: Family Medicine

## 2015-08-12 ENCOUNTER — Ambulatory Visit (INDEPENDENT_AMBULATORY_CARE_PROVIDER_SITE_OTHER): Payer: Medicare Other | Admitting: Family Medicine

## 2015-08-12 ENCOUNTER — Encounter: Payer: Self-pay | Admitting: Family Medicine

## 2015-08-12 VITALS — BP 114/75 | HR 91 | Ht 65.0 in | Wt 144.0 lb

## 2015-08-12 DIAGNOSIS — M79604 Pain in right leg: Secondary | ICD-10-CM | POA: Diagnosis not present

## 2015-08-12 DIAGNOSIS — M25561 Pain in right knee: Secondary | ICD-10-CM

## 2015-08-12 NOTE — Patient Instructions (Signed)
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 We gave you an injection for this. Continue with stretches and the hip raise exercise for your IT band. Otherwise activities and exercise as tolerated. Let me know if you want to do formal physical therapy for this and we can order it.

## 2015-08-13 DIAGNOSIS — M25561 Pain in right knee: Secondary | ICD-10-CM

## 2015-08-13 DIAGNOSIS — M79604 Pain in right leg: Secondary | ICD-10-CM | POA: Diagnosis not present

## 2015-08-13 MED ORDER — METHYLPREDNISOLONE ACETATE 40 MG/ML IJ SUSP
40.0000 mg | Freq: Once | INTRAMUSCULAR | Status: AC
  Administered 2015-08-13: 40 mg via INTRA_ARTICULAR

## 2015-08-16 NOTE — Assessment & Plan Note (Signed)
Patient having a flare of pain in similar area of IT band to a year ago.  Has been doing home exercises and stretches - encouraged to continue these.  Tylenol, tramadol if needed.  Given localized injection.  After informed written consent patient was seated on exam table.  Area overlying right distal IT band near insertion prepped with alcohol swab then injected with 2:1 marcaine: depomedrol.  Patient tolerated procedure well without immediate complications.

## 2015-08-16 NOTE — Progress Notes (Signed)
Patient ID: Brianna Gregory, female   DOB: Dec 14, 1926, 82 y.o.   MRN: [id number]  PCP: Kelton Pillar, MD  Subjective:   HPI: Patient is a 82 y.o. female here for right knee pain.  4/22: Patient reports she was doing wellness testing at [facility]. Around 11am during this she pushed up from chair, was on way around a cone when it felt like her right knee buckled. Did not fall down. Did not hyperextend or obviously twist it that she noticed. Caused pain lateral and posterior knee. Has old radiographs that showed arthritis, chondrocalcinosis. No catching or locking.  5/27: Patient reports pain still present in knee, about 6/10 level. Some swelling. Has been icing, taking ibuprofen and tramadol as needed. No new injuries.  7/20: Patient reports she is doing very well. Takes tylenol as needed. Only had two times where knee was sore - once with water aerobics and another with bocce. Twinge when pushing on lateral aspect of knee when this did hurt. Injection helped. Has been water walking as well. No current complaints.  8/20: Patient reports she has had some soreness in quad and lateral right thigh. Doing home exercises even though these make her a little more sore. No redness or warmth. No new injuries. No pain in knee.  06/03/14: Patient reports right knee has started acting up again. Pain up to 6/10 level. Some proximal quad soreness as well - has been using rolling pin but this hurts worse. Painful to walk. + swelling. Would like x-rays. No catching, locking, giving out. No new injuries.  4/8: Patient reports her knee feels better. Wearing quad sleeve which helps. Still gets pain occasionally but none now. Had some pain yesterday. Slight swelling. No catching, locking, giving out.  4/29: Patient reports she's having lateral knee pain now. Minimal swelling. No catching, locking, giving out. No longer needing quad sleeve.  08/12/15: Patient reports  she's been having worsening lateral right knee pain past several weeks. Feels just below her right knee. No swelling, skin changes, numbness. Pain level is 8/10, sharp. Worse with ambulation, better with rest.  Past Medical History  Diagnosis Date  . Menopause   . Lumbar herniated disc   . Arthritis   . Skin cancer   . Glaucoma   . Urge incontinence   . Diverticulosis     colonoscopy 2007  . HLD (hyperlipidemia)     Current Outpatient Prescriptions on File Prior to Visit  Medication Sig Dispense Refill  . aspirin EC 81 MG tablet Take 81 mg by mouth.    . Carboxymethylcell-Hypromellose (GENTEAL) 0.25-0.3 % GEL Apply to eye at bedtime.      . CELEBREX 200 MG capsule TAKE 1 CAPSULE TWICE DAILY 180 capsule 0  . ciclopirox (PENLAC) 8 % solution Apply topically at bedtime. Apply over nail and surrounding skin. Apply daily over previous coat. After seven (7) days, may remove with alcohol and continue cycle. 6.6 mL 0  . fexofenadine (ALLEGRA) 180 MG tablet Take 180 mg by mouth daily.    . folic acid (FOLVITE) 1 MG tablet Take 1 mg by mouth.    . Hypromellose (GENTEAL) 0.3 % SOLN Apply 1 drop to eye as needed.     . Misc Natural Products (OSTEO BI-FLEX JOINT SHIELD PO) Take 1 tablet by mouth daily.    . Multiple Vitamins-Minerals (CENTRUM SILVER PO) Take 1 tablet by mouth daily.      Marland Kitchen omeprazole (PRILOSEC) 20 MG capsule TAKE 1 CAPSULE DAILY 90 capsule 2  .  polyethylene glycol powder (GLYCOLAX/MIRALAX) powder Take as directed on bottle 255 g 0  . timolol (BETIMOL) 0.5 % ophthalmic solution Place 1 drop into both eyes daily.      . traMADol (ULTRAM) 50 MG tablet Take 1 tablet (50 mg total) by mouth every 8 (eight) hours as needed. 90 tablet 3  . trimethoprim (TRIMPEX) 100 MG tablet Take 100 mg by mouth.     No current facility-administered medications on file prior to visit.    Past Surgical History  Procedure Laterality Date  . Abdominal hysterectomy  1985  . Fixation kyphoplasty  lumbar spine  2005  . Inguinal hernia repair  1947    right  . Appendectomy  1947  . Cataract extraction      bilateral  . Inguinal hernia repair  1971    left    Allergies  Allergen Reactions  . Statins Other (See Comments)    myalgias    Social History   Social History  . Marital Status: Married    Spouse Name: N/A  . Number of Children: 4  . Years of Education: N/A   Occupational History  . retired Marine scientist    Social History Main Topics  . Smoking status: Never Smoker   . Smokeless tobacco: Never Used  . Alcohol Use: 1.2 oz/week    2 Glasses of wine per week  . Drug Use: No  . Sexual Activity: Not Currently   Other Topics Concern  . Not on file   Social History Narrative    Family History  Problem Relation Age of Onset  . Heart failure Mother   . Diabetes Mother   . Heart failure Father   . Diabetes Father   . Breast cancer Sister   . Diabetes Sister     BP 114/75 mmHg  Pulse 91  Ht 5\' 5"  (1.651 m)  Wt 144 lb (65.318 kg)  BMI 23.96 kg/m2  Review of Systems: See HPI above.    Objective:  Physical Exam:  Gen: NAD  Right knee: No gross deformity, ecchymoses, effusion. Distal IT band tenderness near insertion.  No joint line, other tenderness. FROM. Negative ant/post drawers. Negative valgus/varus testing. Negative lachmanns. Negative mcmurrays, apleys, patellar apprehension. NV intact distally.  Left knee: FROM without pain.    Assessment & Plan:  1. Right knee pain - Patient having a flare of pain in similar area of IT band to a year ago.  Has been doing home exercises and stretches - encouraged to continue these.  Tylenol, tramadol if needed.  Given localized injection.  After informed written consent patient was seated on exam table.  Area overlying right distal IT band near insertion prepped with alcohol swab then injected with 2:1 marcaine: depomedrol.  Patient tolerated procedure well without immediate complications.

## 2015-09-24 ENCOUNTER — Emergency Department (HOSPITAL_BASED_OUTPATIENT_CLINIC_OR_DEPARTMENT_OTHER): Payer: Medicare Other

## 2015-09-24 ENCOUNTER — Encounter (HOSPITAL_BASED_OUTPATIENT_CLINIC_OR_DEPARTMENT_OTHER): Payer: Self-pay | Admitting: *Deleted

## 2015-09-24 ENCOUNTER — Emergency Department (HOSPITAL_BASED_OUTPATIENT_CLINIC_OR_DEPARTMENT_OTHER)
Admission: EM | Admit: 2015-09-24 | Discharge: 2015-09-24 | Disposition: A | Discharge status: Home / Self Care | Payer: Medicare Other | Attending: Emergency Medicine | Admitting: Emergency Medicine

## 2015-09-24 DIAGNOSIS — M1990 Unspecified osteoarthritis, unspecified site: Secondary | ICD-10-CM | POA: Diagnosis not present

## 2015-09-24 DIAGNOSIS — I10 Essential (primary) hypertension: Secondary | ICD-10-CM | POA: Diagnosis not present

## 2015-09-24 DIAGNOSIS — Z7982 Long term (current) use of aspirin: Secondary | ICD-10-CM | POA: Diagnosis not present

## 2015-09-24 DIAGNOSIS — Y999 Unspecified external cause status: Secondary | ICD-10-CM | POA: Insufficient documentation

## 2015-09-24 DIAGNOSIS — E785 Hyperlipidemia, unspecified: Secondary | ICD-10-CM | POA: Insufficient documentation

## 2015-09-24 DIAGNOSIS — Y929 Unspecified place or not applicable: Secondary | ICD-10-CM | POA: Insufficient documentation

## 2015-09-24 DIAGNOSIS — S60221A Contusion of right hand, initial encounter: Secondary | ICD-10-CM | POA: Diagnosis not present

## 2015-09-24 DIAGNOSIS — W01198A Fall on same level from slipping, tripping and stumbling with subsequent striking against other object, initial encounter: Secondary | ICD-10-CM | POA: Diagnosis not present

## 2015-09-24 DIAGNOSIS — S0990XA Unspecified injury of head, initial encounter: Secondary | ICD-10-CM | POA: Diagnosis present

## 2015-09-24 DIAGNOSIS — S0101XA Laceration without foreign body of scalp, initial encounter: Secondary | ICD-10-CM | POA: Diagnosis not present

## 2015-09-24 DIAGNOSIS — Z85828 Personal history of other malignant neoplasm of skin: Secondary | ICD-10-CM | POA: Diagnosis not present

## 2015-09-24 DIAGNOSIS — Z79899 Other long term (current) drug therapy: Secondary | ICD-10-CM | POA: Diagnosis not present

## 2015-09-24 DIAGNOSIS — S8001XA Contusion of right knee, initial encounter: Secondary | ICD-10-CM | POA: Diagnosis not present

## 2015-09-24 DIAGNOSIS — Y9301 Activity, walking, marching and hiking: Secondary | ICD-10-CM | POA: Diagnosis not present

## 2015-09-24 HISTORY — DX: Urinary tract infection, site not specified: N39.0

## 2015-09-24 HISTORY — DX: Essential (primary) hypertension: I10

## 2015-09-24 HISTORY — DX: Gastro-esophageal reflux disease without esophagitis: K21.9

## 2015-09-24 HISTORY — DX: Age-related osteoporosis without current pathological fracture: M81.0

## 2015-09-24 HISTORY — DX: Vitamin D deficiency, unspecified: E55.9

## 2015-09-24 MED ORDER — LIDOCAINE-EPINEPHRINE 1 %-1:100000 IJ SOLN
10.0000 mL | Freq: Once | INTRAMUSCULAR | Status: AC
  Administered 2015-09-24: 10 mL
  Filled 2015-09-24: qty 1

## 2015-09-24 NOTE — ED Notes (Signed)
Pt is from [facility] (independent living) and had a witness fall this morning around 0900. EMS reports pt tripped fell onto R side; pt hit head on a counter; pt reports R knee, R middle finger and R wrist pain. Denies LOC, denies taking blood thinners. Pt also has lac to R scalp above ear. Pt denies n/v/d; C-collar in place.

## 2015-09-24 NOTE — ED Provider Notes (Signed)
CSN: [id number]     Arrival date & time 09/24/15  [id number] History   First MD Initiated Contact with Patient 09/24/15 [phone]     Chief Complaint  Patient presents with  . Fall     (Consider location/radiation/quality/duration/timing/severity/associated sxs/prior Treatment) HPI Comments: 82yo F w/ PMH including HTN, HLD, GERD who presents after a fall. Just prior to arrival, the patient was walking when she slipped, falling onto her right side. She struck the side of her head and sustained a laceration which bled a lot but she denies any loss of consciousness. She also hit her right knee and right hand. She denies any pain with movement of her right knee and although she has a bruise on her right hand, she denies significant pain. No neck, chest, or abdominal pain. No hip pain. No extremity numbness or weakness. Tetanus vaccination is up-to-date.  Patient is a 82 y.o. female presenting with fall. The history is provided by the patient.  Fall    Past Medical History  Diagnosis Date  . Menopause   . Lumbar herniated disc   . Arthritis   . Skin cancer   . Glaucoma   . Urge incontinence   . Diverticulosis     colonoscopy 2007  . HLD (hyperlipidemia)   . Hypertension   . Osteoporosis   . UTI (urinary tract infection)   . Vitamin D deficiency   . GERD (gastroesophageal reflux disease)    Past Surgical History  Procedure Laterality Date  . Abdominal hysterectomy  1985  . Fixation kyphoplasty lumbar spine  2005  . Inguinal hernia repair  1947    right  . Appendectomy  1947  . Cataract extraction      bilateral  . Inguinal hernia repair  1971    left   Family History  Problem Relation Age of Onset  . Heart failure Mother   . Diabetes Mother   . Heart failure Father   . Diabetes Father   . Breast cancer Sister   . Diabetes Sister    Social History  Substance Use Topics  . Smoking status: Never Smoker   . Smokeless tobacco: Never Used  . Alcohol Use: 1.2 oz/week    2 Glasses  of wine per week   OB History    Gravida Para Term Preterm AB TAB SAB Ectopic Multiple Living   4 4             Review of Systems 10 Systems reviewed and are negative for acute change except as noted in the HPI.    Allergies  Statins  Home Medications   Prior to Admission medications   Medication Sig Start Date End Date Taking? Authorizing Provider  aspirin EC 81 MG tablet Take 81 mg by mouth.   Yes Historical Provider, MD  calcium carbonate (TUMS) 500 MG chewable tablet Chew 500 mg by mouth. 02/12/15 02/12/16 Yes Historical Provider, MD  Carboxymethylcell-Hypromellose (GENTEAL) 0.25-0.3 % GEL Apply to eye at bedtime.     Yes Historical Provider, MD  CELEBREX 200 MG capsule TAKE 1 CAPSULE TWICE DAILY 03/24/13  Yes Lanice Shirts, MD  Cholecalciferol (VITAMIN D3) 2000 units capsule Take by mouth. 02/12/15 02/12/16 Yes Historical Provider, MD  diclofenac sodium (VOLTAREN) 1 % GEL Apply 2 g topically 4 (four) times daily.   Yes Historical Provider, MD  donepezil (ARICEPT) 5 MG tablet Take 5 mg by mouth at bedtime.   Yes Historical Provider, MD  folic acid (FOLVITE) 1 MG tablet Take  1 mg by mouth.   Yes Historical Provider, MD  Hypromellose (GENTEAL) 0.3 % SOLN Apply 1 drop to eye as needed.    Yes Historical Provider, MD  lisinopril (PRINIVIL,ZESTRIL) 5 MG tablet Take 5 mg by mouth. 02/12/15 02/12/16 Yes Historical Provider, MD  omeprazole (PRILOSEC) 20 MG capsule TAKE 1 CAPSULE DAILY 03/10/14  Yes Lanice Shirts, MD  polyethylene glycol powder (GLYCOLAX/MIRALAX) powder Take as directed on bottle 12/26/12  Yes Lanice Shirts, MD  rosuvastatin (CRESTOR) 10 MG tablet Take 10 mg by mouth. 02/15/15 02/15/16 Yes Historical Provider, MD  timolol (BETIMOL) 0.5 % ophthalmic solution Place 1 drop into both eyes daily.     Yes Historical Provider, MD  trimethoprim (TRIMPEX) 100 MG tablet Take 100 mg by mouth.   Yes Historical Provider, MD  traMADol (ULTRAM) 50 MG tablet Take 1  tablet (50 mg total) by mouth every 8 (eight) hours as needed. 05/17/12   Lanice Shirts, MD   BP 144/85 mmHg  Pulse 63  Temp(Src) 98.3 F (36.8 C) (Oral)  Resp 18  Ht 5\' 7"  (1.702 m)  Wt 143 lb (64.864 kg)  BMI 22.39 kg/m2  SpO2 100% Physical Exam  Constitutional: She is oriented to person, place, and time. She appears well-developed and well-nourished. No distress.  HENT:  Head: Normocephalic.  Moist mucous membranes; 3.5 cm linear laceration on scalp over R ear  Eyes: Conjunctivae are normal. Pupils are equal, round, and reactive to light.  Neck:  In c-collar  Cardiovascular: Normal rate, regular rhythm and normal heart sounds.   No murmur heard. Pulmonary/Chest: Effort normal and breath sounds normal. She exhibits no tenderness.  Abdominal: Soft. Bowel sounds are normal. She exhibits no distension. There is no tenderness.  Musculoskeletal: She exhibits no tenderness.  Normal ROM R knee and no ttp; ecchymosis and swelling of R 3rd MCP joint; mild tenderness of R 3rd PIP joint; normal ROM R hand  Neurological: She is alert and oriented to person, place, and time.  Normal strength and sensation throughout; Fluent speech  Skin: Skin is warm and dry.  Ecchymoses R knee and R 3rd MCP joint on dorsal hand; 3.5 cm laceration R scalp above ear  Psychiatric: She has a normal mood and affect. Judgment normal.  Nursing note and vitals reviewed.   ED Course  .Marland KitchenLaceration Repair Date/Time: 09/24/2015 11:07 AM Performed by: Sharlett Iles Authorized by: Sharlett Iles Consent: Verbal consent obtained. Risks and benefits: risks, benefits and alternatives were discussed Consent given by: patient Patient identity confirmed: verbally with patient Body area: head/neck Location details: scalp Laceration length: 3.5 cm Tendon involvement: none Nerve involvement: none Anesthesia: local infiltration Local anesthetic: lidocaine 1% with epinephrine Anesthetic total: 1  ml Irrigation solution: saline Irrigation method: syringe Amount of cleaning: standard Skin closure: staples Number of sutures: 5 Technique: simple Approximation: close Approximation difficulty: simple Patient tolerance: Patient tolerated the procedure well with no immediate complications   (including critical care time) Labs Review Labs Reviewed - No data to display  Imaging Review Ct Head Wo Contrast  09/24/2015  CLINICAL DATA:  Pain following fall EXAM: CT HEAD WITHOUT CONTRAST CT CERVICAL SPINE WITHOUT CONTRAST TECHNIQUE: Multidetector CT imaging of the head and cervical spine was performed following the standard protocol without intravenous contrast. Multiplanar CT image reconstructions of the cervical spine were also generated. COMPARISON:  Head CT February 02, 2011; brain MRI May 02, 2011 FINDINGS: CT HEAD FINDINGS The ventricles are normal in size and configuration for  age. There is moderate frontal and temporal lobe atrophy, stable. There is also a degree of cerebellar atrophy, stable. There is no intracranial mass, hemorrhage, extra-axial fluid collection, or midline shift. There is patchy small vessel disease in the centra semiovale bilaterally, stable. There is evidence of a prior infarct in the anterior left lentiform nucleus which is stable. This infarct extends to the genu of the left internal capsule, stable. No acute infarct is evident. There are foci of vascular calcification in the cavernous carotid arteries bilaterally. The bony calvarium appears intact. There is mild opacification of several inferior mastoid air cells on the right. The mastoid air cells bilaterally are clear. Orbits appear symmetric bilaterally. There is mucosal thickening in several ethmoid air cells bilaterally. There is small retention cyst in the anterior left sphenoid sinus. There is also small retention cysts in each maxillary antra. There is a torus palatinus, an anatomic variant. CT CERVICAL SPINE  FINDINGS There is no fracture or spondylolisthesis. Prevertebral soft tissues and predental space regions are normal. There is moderately severe disc space narrowing at C3-4, C4-5, and C6-7. There is moderate disc space narrowing at C5-6. There is mild pannus posterior to the odontoid without appreciable impression on the craniocervical junction. There is facet hypertrophy at multiple levels. There is exit foraminal narrowing on the left at C3-4, at C4-5 bilaterally, and at C5-6 bilaterally. Bones appear osteoporotic. There is apical pleural thickening bilaterally. IMPRESSION: CT head: Areas of atrophy, stable. Periventricular small vessel disease, stable. 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 No intracranial mass, hemorrhage, or extra-axial fluid collection. No acute infarct evident. Mild right inferior mastoid air cell disease. Areas of paranasal sinus disease. Bony calvarium appears intact. Foci of arterial vascular calcification noted. CT cervical spine: No demonstrable fracture or spondylolisthesis. Multilevel arthropathy. Bones appear osteoporotic. Electronically Signed   By: Lowella Grip III M.D.   On: 09/24/2015 11:00   Ct Cervical Spine Wo Contrast  09/24/2015  CLINICAL DATA:  Pain following fall EXAM: CT HEAD WITHOUT CONTRAST CT CERVICAL SPINE WITHOUT CONTRAST TECHNIQUE: Multidetector CT imaging of the head and cervical spine was performed following the standard protocol without intravenous contrast. Multiplanar CT image reconstructions of the cervical spine were also generated. COMPARISON:  Head CT February 02, 2011; brain MRI May 02, 2011 FINDINGS: CT HEAD FINDINGS The ventricles are normal in size and configuration for age. There is moderate frontal and temporal lobe atrophy, stable. There is also a degree of cerebellar atrophy, stable. There is no intracranial mass, hemorrhage, extra-axial fluid collection, or midline shift. There is patchy small vessel disease in the centra  semiovale bilaterally, stable. There is evidence of a prior infarct in the anterior left lentiform nucleus which is stable. This infarct extends to the genu of the left internal capsule, stable. No acute infarct is evident. There are foci of vascular calcification in the cavernous carotid arteries bilaterally. The bony calvarium appears intact. There is mild opacification of several inferior mastoid air cells on the right. The mastoid air cells bilaterally are clear. Orbits appear symmetric bilaterally. There is mucosal thickening in several ethmoid air cells bilaterally. There is small retention cyst in the anterior left sphenoid sinus. There is also small retention cysts in each maxillary antra. There is a torus palatinus, an anatomic variant. CT CERVICAL SPINE FINDINGS There is no fracture or spondylolisthesis. Prevertebral soft tissues and predental space regions are normal. There is moderately severe disc space narrowing at C3-4, C4-5, and C6-7. There is moderate disc space  narrowing at C5-6. There is mild pannus posterior to the odontoid without appreciable impression on the craniocervical junction. There is facet hypertrophy at multiple levels. There is exit foraminal narrowing on the left at C3-4, at C4-5 bilaterally, and at C5-6 bilaterally. Bones appear osteoporotic. There is apical pleural thickening bilaterally. IMPRESSION: CT head: Areas of atrophy, stable. Periventricular small vessel disease, stable. 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 No intracranial mass, hemorrhage, or extra-axial fluid collection. No acute infarct evident. Mild right inferior mastoid air cell disease. Areas of paranasal sinus disease. Bony calvarium appears intact. Foci of arterial vascular calcification noted. CT cervical spine: No demonstrable fracture or spondylolisthesis. Multilevel arthropathy. Bones appear osteoporotic. Electronically Signed   By: Lowella Grip III M.D.   On: 09/24/2015 11:00   Dg Hand  Complete Right  09/24/2015  CLINICAL DATA:  Pain following fall EXAM: RIGHT HAND - COMPLETE 3+ VIEW COMPARISON:  None. FINDINGS: Frontal, oblique, and lateral views were obtained. There is no demonstrable fracture or dislocation. There is marked osteoarthritic change in the first carpal-metacarpal joint with bony remodeling in this area. There is mild narrowing of all MCP, PIP, and DIP joints. No erosive change. There is calcification in the triangular fibrocartilage region. Bones are somewhat osteoporotic. IMPRESSION: No acute fracture or dislocation. Question chronic tear of the triangular fibrocartilage. Advanced arthropathy in the first carpal-metacarpal joint. Milder narrowing multiple distal joints. Bones appear osteoporotic. Electronically Signed   By: Lowella Grip III M.D.   On: 09/24/2015 10:44     EKG Interpretation None      MDM   Final diagnoses:  Head injury, initial encounter  Scalp laceration, initial encounter  Hand contusion, right, initial encounter  Knee contusion, right, initial encounter   Patient presents after mechanical fall with no loss of consciousness. She was awake and alert, comfortable with normal neurologic exam. Laceration of scalp above right ear, hemostatic. No complaints of significant pain in her right knee and normal range of motion. Obtained CT of head and C-spine as well as plain film or right hand given bruise. Repair laceration with staples, see procedure note for details.  Hand x-ray as well as CT of head and C-spine negative for acute injury. Discussed supportive care for wound and follow-up planned for staple removal. Reviewed return precautions including any signs of wound infection as well as any neurologic symptoms related to her head injury. Patient voiced understanding and was discharged in satisfactory condition.  Sharlett Iles, MD 09/24/15 [phone]

## 2016-11-03 ENCOUNTER — Other Ambulatory Visit: Payer: Self-pay | Admitting: Internal Medicine

## 2016-11-03 DIAGNOSIS — N6489 Other specified disorders of breast: Secondary | ICD-10-CM

## 2016-11-08 ENCOUNTER — Ambulatory Visit
Admission: RE | Admit: 2016-11-08 | Discharge: 2016-11-08 | Disposition: A | Discharge status: Home / Self Care | Payer: Medicare Other | Source: Ambulatory Visit | Attending: Internal Medicine | Admitting: Internal Medicine

## 2016-11-08 DIAGNOSIS — N6489 Other specified disorders of breast: Secondary | ICD-10-CM

## 2017-04-25 ENCOUNTER — Encounter (HOSPITAL_COMMUNITY): Payer: Self-pay | Admitting: *Deleted

## 2017-04-25 ENCOUNTER — Emergency Department (HOSPITAL_COMMUNITY): Payer: Medicare Other

## 2017-04-25 ENCOUNTER — Inpatient Hospital Stay (HOSPITAL_COMMUNITY)
Admission: EM | Admit: 2017-04-25 | Discharge: 2017-04-30 | DRG: 064 | Disposition: A | Discharge status: Skilled Nursing Facility | Payer: Medicare Other | Attending: Neurology | Admitting: Neurology

## 2017-04-25 ENCOUNTER — Other Ambulatory Visit: Payer: Self-pay

## 2017-04-25 DIAGNOSIS — R402364 Coma scale, best motor response, obeys commands, 24 hours or more after hospital admission: Secondary | ICD-10-CM | POA: Diagnosis not present

## 2017-04-25 DIAGNOSIS — Z823 Family history of stroke: Secondary | ICD-10-CM | POA: Diagnosis not present

## 2017-04-25 DIAGNOSIS — Z7189 Other specified counseling: Secondary | ICD-10-CM | POA: Diagnosis not present

## 2017-04-25 DIAGNOSIS — R414 Neurologic neglect syndrome: Secondary | ICD-10-CM | POA: Diagnosis present

## 2017-04-25 DIAGNOSIS — I161 Hypertensive emergency: Secondary | ICD-10-CM | POA: Diagnosis present

## 2017-04-25 DIAGNOSIS — I611 Nontraumatic intracerebral hemorrhage in hemisphere, cortical: Principal | ICD-10-CM

## 2017-04-25 DIAGNOSIS — G936 Cerebral edema: Secondary | ICD-10-CM | POA: Diagnosis present

## 2017-04-25 DIAGNOSIS — G8191 Hemiplegia, unspecified affecting right dominant side: Secondary | ICD-10-CM | POA: Diagnosis present

## 2017-04-25 DIAGNOSIS — I629 Nontraumatic intracranial hemorrhage, unspecified: Secondary | ICD-10-CM | POA: Diagnosis not present

## 2017-04-25 DIAGNOSIS — R1310 Dysphagia, unspecified: Secondary | ICD-10-CM | POA: Diagnosis present

## 2017-04-25 DIAGNOSIS — R29721 NIHSS score 21: Secondary | ICD-10-CM | POA: Diagnosis present

## 2017-04-25 DIAGNOSIS — H409 Unspecified glaucoma: Secondary | ICD-10-CM | POA: Diagnosis present

## 2017-04-25 DIAGNOSIS — F0390 Unspecified dementia without behavioral disturbance: Secondary | ICD-10-CM | POA: Diagnosis present

## 2017-04-25 DIAGNOSIS — Z85828 Personal history of other malignant neoplasm of skin: Secondary | ICD-10-CM

## 2017-04-25 DIAGNOSIS — K219 Gastro-esophageal reflux disease without esophagitis: Secondary | ICD-10-CM | POA: Diagnosis present

## 2017-04-25 DIAGNOSIS — Z888 Allergy status to other drugs, medicaments and biological substances status: Secondary | ICD-10-CM

## 2017-04-25 DIAGNOSIS — Z515 Encounter for palliative care: Secondary | ICD-10-CM

## 2017-04-25 DIAGNOSIS — Z7982 Long term (current) use of aspirin: Secondary | ICD-10-CM

## 2017-04-25 DIAGNOSIS — R402134 Coma scale, eyes open, to sound, 24 hours or more after hospital admission: Secondary | ICD-10-CM | POA: Diagnosis not present

## 2017-04-25 DIAGNOSIS — Z90710 Acquired absence of both cervix and uterus: Secondary | ICD-10-CM | POA: Diagnosis not present

## 2017-04-25 DIAGNOSIS — N390 Urinary tract infection, site not specified: Secondary | ICD-10-CM | POA: Diagnosis present

## 2017-04-25 DIAGNOSIS — R4701 Aphasia: Secondary | ICD-10-CM | POA: Diagnosis present

## 2017-04-25 DIAGNOSIS — Z8249 Family history of ischemic heart disease and other diseases of the circulatory system: Secondary | ICD-10-CM

## 2017-04-25 DIAGNOSIS — Z66 Do not resuscitate: Secondary | ICD-10-CM | POA: Diagnosis present

## 2017-04-25 DIAGNOSIS — R29810 Facial weakness: Secondary | ICD-10-CM | POA: Diagnosis present

## 2017-04-25 DIAGNOSIS — Z791 Long term (current) use of non-steroidal anti-inflammatories (NSAID): Secondary | ICD-10-CM

## 2017-04-25 DIAGNOSIS — I10 Essential (primary) hypertension: Secondary | ICD-10-CM | POA: Diagnosis present

## 2017-04-25 DIAGNOSIS — R402212 Coma scale, best verbal response, none, at arrival to emergency department: Secondary | ICD-10-CM | POA: Diagnosis present

## 2017-04-25 DIAGNOSIS — R402142 Coma scale, eyes open, spontaneous, at arrival to emergency department: Secondary | ICD-10-CM | POA: Diagnosis present

## 2017-04-25 DIAGNOSIS — I619 Nontraumatic intracerebral hemorrhage, unspecified: Secondary | ICD-10-CM

## 2017-04-25 DIAGNOSIS — E876 Hypokalemia: Secondary | ICD-10-CM | POA: Diagnosis not present

## 2017-04-25 DIAGNOSIS — R29724 NIHSS score 24: Secondary | ICD-10-CM | POA: Diagnosis not present

## 2017-04-25 DIAGNOSIS — M810 Age-related osteoporosis without current pathological fracture: Secondary | ICD-10-CM | POA: Diagnosis present

## 2017-04-25 DIAGNOSIS — R402224 Coma scale, best verbal response, incomprehensible words, 24 hours or more after hospital admission: Secondary | ICD-10-CM | POA: Diagnosis not present

## 2017-04-25 DIAGNOSIS — R402362 Coma scale, best motor response, obeys commands, at arrival to emergency department: Secondary | ICD-10-CM | POA: Diagnosis present

## 2017-04-25 DIAGNOSIS — Z803 Family history of malignant neoplasm of breast: Secondary | ICD-10-CM

## 2017-04-25 DIAGNOSIS — D72829 Elevated white blood cell count, unspecified: Secondary | ICD-10-CM

## 2017-04-25 DIAGNOSIS — E785 Hyperlipidemia, unspecified: Secondary | ICD-10-CM | POA: Diagnosis present

## 2017-04-25 DIAGNOSIS — Z9842 Cataract extraction status, left eye: Secondary | ICD-10-CM

## 2017-04-25 DIAGNOSIS — Z9841 Cataract extraction status, right eye: Secondary | ICD-10-CM

## 2017-04-25 DIAGNOSIS — E860 Dehydration: Secondary | ICD-10-CM

## 2017-04-25 LAB — COMPREHENSIVE METABOLIC PANEL
ALBUMIN: 3.5 g/dL (ref 3.5–5.0)
ALT: 12 U/L — ABNORMAL LOW (ref 14–54)
AST: 27 U/L (ref 15–41)
Alkaline Phosphatase: 38 U/L (ref 38–126)
Anion gap: 10 (ref 5–15)
BUN: 29 mg/dL — AB (ref 6–20)
CHLORIDE: 107 mmol/L (ref 101–111)
CO2: 24 mmol/L (ref 22–32)
Calcium: 8.8 mg/dL — ABNORMAL LOW (ref 8.9–10.3)
Creatinine, Ser: 0.83 mg/dL (ref 0.44–1.00)
GFR calc Af Amer: >60 mL/min (ref >60)
GFR calc non Af Amer: >60 mL/min (ref >60)
GLUCOSE: 153 mg/dL — AB (ref 65–99)
POTASSIUM: 4 mmol/L (ref 3.5–5.1)
Sodium: 141 mmol/L (ref 135–145)
Total Bilirubin: 0.4 mg/dL (ref 0.3–1.2)
Total Protein: 6 g/dL — ABNORMAL LOW (ref 6.5–8.1)

## 2017-04-25 LAB — CBC
HEMATOCRIT: 40.5 % (ref 36.0–46.0)
HEMOGLOBIN: 12.9 g/dL (ref 12.0–15.0)
MCH: 30.1 pg (ref 26.0–34.0)
MCHC: 31.9 g/dL (ref 30.0–36.0)
MCV: 94.6 fL (ref 78.0–100.0)
Platelets: 168 10*3/uL (ref 150–400)
RBC: 4.28 MIL/uL (ref 3.87–5.11)
RDW: 15.1 % (ref 11.5–15.5)
WBC: 8.5 10*3/uL (ref 4.0–10.5)

## 2017-04-25 LAB — DIFFERENTIAL
BASOS ABS: 0 10*3/uL (ref 0.0–0.1)
BASOS PCT: 0 %
EOS ABS: 0.2 10*3/uL (ref 0.0–0.7)
Eosinophils Relative: 2 %
LYMPHS ABS: 2.3 10*3/uL (ref 0.7–4.0)
Lymphocytes Relative: 26 %
MONOS PCT: 6 %
Monocytes Absolute: 0.5 10*3/uL (ref 0.1–1.0)
Neutro Abs: 5.6 10*3/uL (ref 1.7–7.7)
Neutrophils Relative %: 66 %

## 2017-04-25 LAB — I-STAT TROPONIN, ED: Troponin i, poc: 0 ng/mL (ref 0.00–0.08)

## 2017-04-25 LAB — PROTIME-INR
INR: 1.02
Prothrombin Time: 13.3 seconds (ref 11.4–15.2)

## 2017-04-25 LAB — I-STAT CHEM 8, ED
BUN: 29 mg/dL — AB (ref 6–20)
CHLORIDE: 106 mmol/L (ref 101–111)
CREATININE: 0.8 mg/dL (ref 0.44–1.00)
Calcium, Ion: 1.19 mmol/L (ref 1.15–1.40)
Glucose, Bld: 150 mg/dL — ABNORMAL HIGH (ref 65–99)
HEMATOCRIT: 40 % (ref 36.0–46.0)
Hemoglobin: 13.6 g/dL (ref 12.0–15.0)
POTASSIUM: 3.9 mmol/L (ref 3.5–5.1)
SODIUM: 144 mmol/L (ref 135–145)
TCO2: 26 mmol/L (ref 22–32)

## 2017-04-25 LAB — APTT: APTT: 27 s (ref 24–36)

## 2017-04-25 LAB — CBG MONITORING, ED: Glucose-Capillary: 145 mg/dL — ABNORMAL HIGH (ref 65–99)

## 2017-04-25 LAB — MRSA PCR SCREENING: MRSA by PCR: NEGATIVE

## 2017-04-25 MED ORDER — STROKE: EARLY STAGES OF RECOVERY BOOK
Freq: Once | Status: DC
  Filled 2017-04-25: qty 1

## 2017-04-25 MED ORDER — ACETAMINOPHEN 650 MG RE SUPP: 650.0000 mg | RECTAL | Status: DC | PRN

## 2017-04-25 MED ORDER — ACETAMINOPHEN 325 MG PO TABS: 650.0000 mg | ORAL_TABLET | ORAL | Status: DC | PRN

## 2017-04-25 MED ORDER — NICARDIPINE HCL IN NACL 20-0.86 MG/200ML-% IV SOLN
0.0000 mg/h | INTRAVENOUS | Status: DC
  Administered 2017-04-25: 2.5 mg/h via INTRAVENOUS
  Administered 2017-04-25 – 2017-04-26 (×2): 5 mg/h via INTRAVENOUS
  Administered 2017-04-26 (×3): 3 mg/h via INTRAVENOUS
  Administered 2017-04-26 (×3): 10 mg/h via INTRAVENOUS
  Administered 2017-04-27 (×3): 7.5 mg/h via INTRAVENOUS
  Filled 2017-04-25 (×11): qty 200

## 2017-04-25 MED ORDER — IOPAMIDOL (ISOVUE-370) INJECTION 76%
50.0000 mL | Freq: Once | INTRAVENOUS | Status: AC | PRN
  Administered 2017-04-25: 50 mL via INTRAVENOUS

## 2017-04-25 MED ORDER — SENNOSIDES-DOCUSATE SODIUM 8.6-50 MG PO TABS
1.0000 | ORAL_TABLET | Freq: Two times a day (BID) | ORAL | Status: DC
  Administered 2017-04-27 – 2017-04-30 (×5): 1 via ORAL
  Filled 2017-04-25 (×7): qty 1

## 2017-04-25 MED ORDER — PANTOPRAZOLE SODIUM 40 MG IV SOLR
40.0000 mg | Freq: Every day | INTRAVENOUS | Status: DC
  Administered 2017-04-25 – 2017-04-28 (×4): 40 mg via INTRAVENOUS
  Filled 2017-04-25 (×3): qty 40

## 2017-04-25 MED ORDER — ACETAMINOPHEN 160 MG/5ML PO SOLN: 650.0000 mg | ORAL | Status: DC | PRN

## 2017-04-25 NOTE — ED Triage Notes (Signed)
Pt arrives to ED from independent living via EMS with complaints of confusion since 1445; last known well 1430 this afternoon. EMS reports pt was talking with family when she became confused. Code stroke called pta, pt cleared for Ct by EDP and brought to Ct scanner 1. After CT, EDP assessed pt and pt brought directly from CT scanner to Neuro ICU where bedside report was given. Rapid response RN to chart code stroke log and NIH. Pt placed in position of comfort with bed locked and lowered, call bell in reach. Family updated by Neurologist and waiting in waiting room.

## 2017-04-25 NOTE — Code Documentation (Signed)
84 year old female presents to [facility] via [facility] as code stroke.  Patient was LSW at 108 by family members visiting - she had sudden onset of confusion which progressed to right side weakness and aphasia.  Patient was met at the bridge in ED by the stroke team and the ED staff.  Airway was cleared. Labs drawn - CBG 145.   She is alert - crying - unable to speak - right arm flacid - right leg slight withdrawal to pain.  Left gaze preference - right field cut.  Right facial droop.  NIHSS 21.  She was taken to CT scan which revealed a large ICH.  BP was 166/78 with HR 78 - 10 mg Labetalol IV given per order Dr. Lorraine Lax who is at the bedside.   Repeat BP 136/66 HR 72.  CTA was completed. HOB elevated post scan. Continues to protect airway.  Dr. Ralene Bathe to bedside.    BP now 155/78 HR 81 - Cardene drip initiated at 5 mg/hr.  Transferred to 4N24 - BP 135/77 HR 92 - Daughter to bedside - patient alert - crying.  Handoff to [employer].

## 2017-04-25 NOTE — H&P (Signed)
Chief Complaint: Aphasia, Confusion  History obtained from: Patient and Chart     HPI:                                                                                                                                       Brianna Gregory is an 84 y.o. female with PMH of HTN, HLD, mild dementia who presented as a stroke alert with aphasia and right hemiplegia.  LKN was 2.30 pm, around 2.45 pm family noticed she was acting confused which gradually become worse. When EMS arrived patient was nonverbal and not moving right side. Bp was 937 systolic. CT head showed a large left frontal hemorrhage.   Date last known well: 1.30.19 Time last known well: 2.45 pm tPA Given: no, hemorrhage NIHSS: 21 Baseline MRS 1    Past Medical History:  Diagnosis Date  . Arthritis   . Diverticulosis    colonoscopy 2007  . GERD (gastroesophageal reflux disease)   . Glaucoma   . HLD (hyperlipidemia)   . Hypertension   . Lumbar herniated disc   . Menopause   . Osteoporosis   . Skin cancer   . Urge incontinence   . UTI (urinary tract infection)   . Vitamin D deficiency     Past Surgical History:  Procedure Laterality Date  . ABDOMINAL HYSTERECTOMY  1985  . APPENDECTOMY  1947  . CATARACT EXTRACTION     bilateral  . FIXATION KYPHOPLASTY LUMBAR SPINE  2005  . INGUINAL HERNIA REPAIR  1947   right  . INGUINAL HERNIA REPAIR  1971   left    Family History  Problem Relation Age of Onset  . Heart failure Mother   . Diabetes Mother   . Heart failure Father   . Diabetes Father   . Breast cancer Sister   . Diabetes Sister    Social History:  reports that  has never smoked. she has never used smokeless tobacco. She reports that she drinks about 1.2 oz of alcohol per week. She reports that she does not use drugs.  Allergies:  Allergies  Allergen Reactions  . Statins Other (See Comments)    myalgias    Medications:                                                                                                                         I  reviewed home medications   ROS:                                                                                                                                     14 systems reviewed and negative except above    Examination:                                                                                                      General: Appears well-developed and well-nourished.  Psych: Affect appropriate to situation Eyes: No scleral injection HENT: No OP obstrucion Head: Normocephalic.  Cardiovascular: Normal rate and regular rhythm.  Respiratory: Effort normal and breath sounds normal to anterior ascultation GI: Soft.  No distension. There is no tenderness.  Skin: WDI   Neurological Examination Mental Status: Awake, non verbal, does not follow commands Cranial Nerves: II: Visual fields: R HH III,IV, VI: gaze deviation to left side V,VII: smile symmetric, facial light touch sensation normal bilaterally VIII: hearing normal bilaterally IX,X: uvula rises symmetrically XI: bilateral shoulder shrug XII: midline tongue extension Motor: Right : Upper extremity   0/5    Left:     Upper extremity   5/5  Lower extremity   1/5     Lower extremity   5/5 Tone and bulk:normal tone throughout; no atrophy noted Sensory: appears to neglect right side Deep Tendon Reflexes: 2+ and symmetric throughout Plantars: Right: downgoing   Left: downgoing Cerebellar: Unable to assess Gait: unable to walk     Lab Results: Basic Metabolic Panel: Recent Labs  Lab 04/25/17 1625 04/25/17 1626  NA 144 141  K 3.9 4.0  CL 106 107  CO2  --  24  GLUCOSE 150* 153*  BUN 29* 29*  CREATININE 0.80 0.83  CALCIUM  --  8.8*    CBC: Recent Labs  Lab 04/25/17 1625 04/25/17 1626  WBC  --  8.5  NEUTROABS  --  5.6  HGB 13.6 12.9  HCT 40.0 40.5  MCV  --  94.6  PLT  --  168    Coagulation Studies: Recent Labs    04/25/17 1626  LABPROT 13.3   INR 1.02    Imaging: Ct Angio Head W Or Wo Contrast  Result Date: 04/25/2017 CLINICAL DATA:  Code stroke. Initial evaluation for acute stroke, right-sided deficits. EXAM: CT ANGIOGRAPHY HEAD TECHNIQUE: Multidetector CT imaging of the head was performed using the standard protocol during bolus administration of intravenous contrast. Multiplanar CT image reconstructions and MIPs were obtained to  evaluate the vascular anatomy. CONTRAST:  50 cc of Isovue 370. COMPARISON:  Prior CT from 09/24/2015. FINDINGS: CT HEAD Brain: Large intraparenchymal hematoma measuring 7.9 x 3.4 x 5.1 cm present at the anterior left cerebral convexity (estimated volume 68 cc). Mild surrounding low-density vasogenic edema with regional mass effect. Trace 3 mm left-to-right shift. No hydrocephalus or ventricular trapping. Basilar cisterns remain patent. Associated small volume subarachnoid hemorrhage noted within the adjacent left frontal lobe possible extra-axial extension with small amount of subdural blood overlying the adjacent left cerebral convexity. No other acute intracranial hemorrhage. No other acute large vessel territory infarct. Moderate atrophy with chronic small vessel ischemic disease. Small remote lacunar infarct noted within the left basal ganglia. No mass lesion. Vascular: No hyperdense vessel. Scattered vascular calcifications noted within the carotid siphons. Skull: Scalp soft tissues and calvarium within normal limits. Sinuses: Few scattered subcentimeter retention cyst noted within the right maxillary and left sphenoid sinuses. Mild mucosal thickening within the ethmoidal air cells on the left. Paranasal sinuses are otherwise clear. Torus palatini partially visualized. Mastoid air cells are clear. Cyst noted. Paranasal sinuses Orbits: Globes and orbital soft tissues within normal limits. Patient status post lens extraction bilaterally. CTA HEAD Anterior circulation: Visualized distal cervical segments of the  internal carotid arteries are well opacified and symmetric bilaterally. Petrous segments widely patent bilaterally. Mild scattered atheromatous plaque within the cavernous ICAs without significant stenosis. Mild atheromatous narrowing at the supraclinoid left ICA. Supraclinoid right ICA widely patent. ICA termini widely patent. A1 segments well opacified and symmetric. Patent anterior communicating artery. Anterior cerebral arteries widely patent to their distal aspects without stenosis. M1 segments widely patent without stenosis or occlusion. Normal MCA bifurcations. Distal MCA branches well perfused and fairly symmetric. Mild mass effect on the distal left MCA branches due to the large intraparenchymal hematoma. No vascular out formation or other finding underlying the hematoma identified. Posterior circulation: Left vertebral artery dominant and widely patent to the vertebrobasilar junction. Right vertebral artery hypoplastic with multifocal atheromatous irregularity with mild right V4 stenosis. Left PICA patent. Right PICA not well visualized. Basilar artery mildly irregular but widely patent to its distal aspect without stenosis. Superior cerebral arteries patent proximally. Both of the posterior cerebral arteries largely supplied via the basilar and are well perfused to their distal aspects without high-grade stenosis. Small bilateral posterior communicating arteries noted. Venous sinuses: Grossly patent, although not well evaluated due to arterial timing of the contrast bolus. Anatomic variants: None significant. No vascular abnormality seen underlying the left frontal hematoma. Delayed phase: Not performed. IMPRESSION: 1. 7.9 x 3.4 x 5.1 cm acute intraparenchymal hematoma positioned within the anterior left frontal lobe (estimated volume 68 cc). Associated regional mass effect with trace 3 mm left-to-right shift. Primary differential considerations include hypertensive bleed or possibly amyloid angiopathy.  2. Underlying moderate cerebral atrophy with chronic small vessel ischemic disease. 3. Negative intracranial CTA. No vascular abnormality seen underlying the intraparenchymal hematoma. No large vessel occlusion. 4. Mild for age intracranial atherosclerosis as above. No high-grade or correctable stenosis. Critical Value/emergent results were called by telephone at the time of interpretation on 04/25/2017 at 4:40 pm to Dr. Samara Snide , who verbally acknowledged these results. Electronically Signed   By: Jeannine Boga M.D.   On: 04/25/2017 17:25   Ct Head Code Stroke Wo Contrast  Result Date: 04/25/2017 CLINICAL DATA:  Code stroke. Initial evaluation for acute stroke, right-sided deficits. EXAM: CT ANGIOGRAPHY HEAD TECHNIQUE: Multidetector CT imaging of the head was performed using the  standard protocol during bolus administration of intravenous contrast. Multiplanar CT image reconstructions and MIPs were obtained to evaluate the vascular anatomy. CONTRAST:  50 cc of Isovue 370. COMPARISON:  Prior CT from 09/24/2015. FINDINGS: CT HEAD Brain: Large intraparenchymal hematoma measuring 7.9 x 3.4 x 5.1 cm present at the anterior left cerebral convexity (estimated volume 68 cc). Mild surrounding low-density vasogenic edema with regional mass effect. Trace 3 mm left-to-right shift. No hydrocephalus or ventricular trapping. Basilar cisterns remain patent. Associated small volume subarachnoid hemorrhage noted within the adjacent left frontal lobe possible extra-axial extension with small amount of subdural blood overlying the adjacent left cerebral convexity. No other acute intracranial hemorrhage. No other acute large vessel territory infarct. Moderate atrophy with chronic small vessel ischemic disease. Small remote lacunar infarct noted within the left basal ganglia. No mass lesion. Vascular: No hyperdense vessel. Scattered vascular calcifications noted within the carotid siphons. Skull: Scalp soft tissues and  calvarium within normal limits. Sinuses: Few scattered subcentimeter retention cyst noted within the right maxillary and left sphenoid sinuses. Mild mucosal thickening within the ethmoidal air cells on the left. Paranasal sinuses are otherwise clear. Torus palatini partially visualized. Mastoid air cells are clear. Cyst noted. Paranasal sinuses Orbits: Globes and orbital soft tissues within normal limits. Patient status post lens extraction bilaterally. CTA HEAD Anterior circulation: Visualized distal cervical segments of the internal carotid arteries are well opacified and symmetric bilaterally. Petrous segments widely patent bilaterally. Mild scattered atheromatous plaque within the cavernous ICAs without significant stenosis. Mild atheromatous narrowing at the supraclinoid left ICA. Supraclinoid right ICA widely patent. ICA termini widely patent. A1 segments well opacified and symmetric. Patent anterior communicating artery. Anterior cerebral arteries widely patent to their distal aspects without stenosis. M1 segments widely patent without stenosis or occlusion. Normal MCA bifurcations. Distal MCA branches well perfused and fairly symmetric. Mild mass effect on the distal left MCA branches due to the large intraparenchymal hematoma. No vascular out formation or other finding underlying the hematoma identified. Posterior circulation: Left vertebral artery dominant and widely patent to the vertebrobasilar junction. Right vertebral artery hypoplastic with multifocal atheromatous irregularity with mild right V4 stenosis. Left PICA patent. Right PICA not well visualized. Basilar artery mildly irregular but widely patent to its distal aspect without stenosis. Superior cerebral arteries patent proximally. Both of the posterior cerebral arteries largely supplied via the basilar and are well perfused to their distal aspects without high-grade stenosis. Small bilateral posterior communicating arteries noted. Venous  sinuses: Grossly patent, although not well evaluated due to arterial timing of the contrast bolus. Anatomic variants: None significant. No vascular abnormality seen underlying the left frontal hematoma. Delayed phase: Not performed. IMPRESSION: 1. 7.9 x 3.4 x 5.1 cm acute intraparenchymal hematoma positioned within the anterior left frontal lobe (estimated volume 68 cc). Associated regional mass effect with trace 3 mm left-to-right shift. Primary differential considerations include hypertensive bleed or possibly amyloid angiopathy. 2. Underlying moderate cerebral atrophy with chronic small vessel ischemic disease. 3. Negative intracranial CTA. No vascular abnormality seen underlying the intraparenchymal hematoma. No large vessel occlusion. 4. Mild for age intracranial atherosclerosis as above. No high-grade or correctable stenosis. Critical Value/emergent results were called by telephone at the time of interpretation on 04/25/2017 at 4:40 pm to Dr. Samara Snide , who verbally acknowledged these results. Electronically Signed   By: Jeannine Boga M.D.   On: 04/25/2017 17:25     ASSESSMENT AND PLAN  84 y.o. female with PMH of HTN, HLD, mild dementia with large left frontal  hemorrhage. BP elevated on arrival. Ct head showed left frontal hemorrhage and CTA was negative for aneurysm, AVM. Etiology likely HTN vs amyloid. Patient no on anticoagulants. Coags wnl. Patient is DNR so will not intubate.   Left Frontal hemorrhage Vasogenic and cytotoxic cerebral edema with midline shift   ICH score 3  CT head : 7.9 x 3.4 x 5.1 cm acute intraparenchymal hematoma positioned within the anterior left frontal lobe (estimated volume 68 cc). Associated regional mass effect with trace 3 mm left-to-right shift.  Plan Started patient on cardene gtt, goal BP <140 SBP Hold Antiplatelets SCD for DVT ppx Repeat CT head in 6 hrs   HTN Management as above  HLD Hold statin    This patient is neurologically  critically ill due to large hemorrhage.  She is at risk for significant risk of neurological worsening from cerebral edema,  death from brain herniation, heart failure, infection, respiratory failure and seizure. This patient's care requires constant monitoring of vital signs, hemodynamics, respiratory and cardiac monitoring, review of multiple databases, neurological assessment, discussion with family, other specialists and medical decision making of high complexity. Family understands that she may die from this, do not want to pursue aggressive measures such as intubation  I spent  60  minutes of neurocritical time in the care of this patient.      Sushanth Aroor [facility] Pager Number [phone]

## 2017-04-25 NOTE — ED Notes (Signed)
Daughter in waiting room

## 2017-04-25 NOTE — ED Provider Notes (Signed)
[facility] EMERGENCY DEPARTMENT Provider Note   CSN: [id number] Arrival date & time: 04/25/17  1613     History   Chief Complaint No chief complaint on file.   HPI Brianna Gregory is a 84 y.o. female.  The history is provided by the EMS personnel. No language interpreter was used.   Brianna Gregory is a 84 y.o. female who presents to the Emergency Department complaining of AMS.  Level V caveat due to AMS.  History is provided by EMS.  She presents for altered mental status and decreased level of consciousness that started at 230 this afternoon when family was visiting.  She is a resident of independent living.  Past Medical History:  Diagnosis Date  . Arthritis   . Diverticulosis    colonoscopy 2007  . GERD (gastroesophageal reflux disease)   . Glaucoma   . HLD (hyperlipidemia)   . Hypertension   . Lumbar herniated disc   . Menopause   . Osteoporosis   . Skin cancer   . Urge incontinence   . UTI (urinary tract infection)   . Vitamin D deficiency     Patient Active Problem List   Diagnosis Date Noted  . ICH (intracerebral hemorrhage) ([place]) 04/25/2017  . Left knee pain 09/30/2014  . Toe pain, left 09/24/2014  . Frequent UTI 04/28/2014  . Mild cognitive disorder 03/06/2014  . BP (high blood pressure) 03/03/2014  . Right knee injury 07/17/2013  . Right ankle pain 06/12/2012  . Right knee pain 06/12/2012  . Labial lesion 06/04/2012  . Paresthesias/numbness 06/04/2012  . Pain in joint, ankle and foot 06/04/2012  . GERD (gastroesophageal reflux disease) 01/29/2012  . Other and unspecified hyperlipidemia 01/29/2012  . Numbness of toes 01/29/2012  . Osteoporosis 01/29/2012  . Back pain 03/14/2011  . History of compression fracture of spine 03/14/2011  . Headache(784.0) 02/01/2011  . Rectal polyp 01/12/2011  . Heel spur 12/22/2010  . Arthritis   . Skin cancer   . Menopause   . Glaucoma   . Lumbar herniated disc   . Urge incontinence     . Diverticulosis     Past Surgical History:  Procedure Laterality Date  . ABDOMINAL HYSTERECTOMY  1985  . APPENDECTOMY  1947  . CATARACT EXTRACTION     bilateral  . FIXATION KYPHOPLASTY LUMBAR SPINE  2005  . INGUINAL HERNIA REPAIR  1947   right  . INGUINAL HERNIA REPAIR  1971   left    OB History    Gravida Para Term Preterm AB Living   4 4           SAB TAB Ectopic Multiple Live Births                   Home Medications    Prior to Admission medications   Medication Sig Start Date End Date Taking? Authorizing Provider  aspirin EC 81 MG tablet Take 81 mg by mouth.    [provider]  Carboxymethylcell-Hypromellose (GENTEAL) 0.25-0.3 % GEL Apply to eye at bedtime.      [provider]  CELEBREX 200 MG capsule TAKE 1 CAPSULE TWICE DAILY 03/24/13   Schoenhoff, Altamese Cabal, MD  diclofenac sodium (VOLTAREN) 1 % GEL Apply 2 g topically 4 (four) times daily.    [provider]  donepezil (ARICEPT) 5 MG tablet Take 5 mg by mouth at bedtime.    [provider]  folic acid (FOLVITE) 1 MG tablet Take 1 mg by mouth.  [provider]  Hypromellose (GENTEAL) 0.3 % SOLN Apply 1 drop to eye as needed.     [provider]  lisinopril (PRINIVIL,ZESTRIL) 5 MG tablet Take 5 mg by mouth. 02/12/15 02/12/16  [provider]  omeprazole (PRILOSEC) 20 MG capsule TAKE 1 CAPSULE DAILY 03/10/14   Schoenhoff, Altamese Cabal, MD  polyethylene glycol powder (GLYCOLAX/MIRALAX) powder Take as directed on bottle 12/26/12   Schoenhoff, Altamese Cabal, MD  rosuvastatin (CRESTOR) 10 MG tablet Take 10 mg by mouth. 02/15/15 02/15/16  [provider]  timolol (BETIMOL) 0.5 % ophthalmic solution Place 1 drop into both eyes daily.      [provider]  traMADol (ULTRAM) 50 MG tablet Take 1 tablet (50 mg total) by mouth every 8 (eight) hours as needed. 05/17/12   Schoenhoff, Altamese Cabal, MD  trimethoprim (TRIMPEX) 100 MG tablet Take 100 mg by mouth.     [provider]    Family History Family History  Problem Relation Age of Onset  . Heart failure Mother   . Diabetes Mother   . Heart failure Father   . Diabetes Father   . Breast cancer Sister   . Diabetes Sister     Social History Social History   Tobacco Use  . Smoking status: Never Smoker  . Smokeless tobacco: Never Used  Substance Use Topics  . Alcohol use: Yes    Alcohol/week: 1.2 oz    Types: 2 Glasses of wine per week  . Drug use: No     Allergies   Statins   Review of Systems Review of Systems  All other systems reviewed and are negative.    Physical Exam Updated Vital Signs Wt 62.6 kg (138 lb 0.1 oz)   BMI 21.62 kg/m   Physical Exam  Constitutional: She appears well-developed and well-nourished.  HENT:  Head: Normocephalic and atraumatic.  Cardiovascular: Normal rate and regular rhythm.  No murmur heard. Pulmonary/Chest: Effort normal and breath sounds normal. No respiratory distress.  Abdominal: Soft. There is no tenderness. There is no rebound and no guarding.  Musculoskeletal: She exhibits no edema or tenderness.  Neurological: She is alert.  Alert. Nonverbal.  Right hemiparesis.  Unable to follow commands.  Left-sided gaze preference.  Skin: Skin is warm and dry.  Psychiatric: She has a normal mood and affect. Her behavior is normal.  Nursing note and vitals reviewed.    ED Treatments / Results  Labs (all labs ordered are listed, but only abnormal results are displayed) Labs Reviewed  CBG MONITORING, ED - Abnormal; Notable for the following components:      Result Value   Glucose-Capillary 145 (*)    All other components within normal limits  I-STAT CHEM 8, ED - Abnormal; Notable for the following components:   BUN 29 (*)    Glucose, Bld 150 (*)    All other components within normal limits  CBC  DIFFERENTIAL  PROTIME-INR  APTT  COMPREHENSIVE METABOLIC PANEL  I-STAT TROPONIN, ED    EKG  EKG  Interpretation None       Radiology No results found.  Procedures Procedures (including critical care time)  CRITICAL CARE Performed by: Quintella Reichert   Total critical care time: 10 minutes  Critical care time was exclusive of separately billable procedures and treating other patients.  Critical care was necessary to treat or prevent imminent or life-threatening deterioration.  Critical care was time spent personally by me on the following activities: development of treatment plan with patient and/or  surrogate as well as nursing, discussions with consultants, evaluation of patient's response to treatment, examination of patient, obtaining history from patient or surrogate, ordering and performing treatments and interventions, ordering and review of laboratory studies, ordering and review of radiographic studies, pulse oximetry and re-evaluation of patient's condition.  Medications Ordered in ED Medications   stroke: mapping our early stages of recovery book (not administered)  acetaminophen (TYLENOL) tablet 650 mg (not administered)    Or  acetaminophen (TYLENOL) solution 650 mg (not administered)    Or  acetaminophen (TYLENOL) suppository 650 mg (not administered)  senna-docusate (Senokot-S) tablet 1 tablet (not administered)  pantoprazole (PROTONIX) injection 40 mg (not administered)  nicardipine (CARDENE) 20mg  in 0.86% saline 267ml IV infusion (0.1 mg/ml) (not administered)  iopamidol (ISOVUE-370) 76 % injection 50 mL (50 mLs Intravenous Contrast Given 04/25/17 1640)     Initial Impression / Assessment and Plan / ED Course  I have reviewed the triage vital signs and the nursing notes.  Pertinent labs & imaging results that were available during my care of the patient were reviewed by me and considered in my medical decision making (see chart for details).     Patient presented via EMS as a code stroke for altered mental status.  Patient with profound deficits on ED  evaluation but protecting her airway.  She was transferred emergently to the CT scanner for imaging.  CT scan demonstrates large hemorrhagic stroke.  She was evaluated by neurology in the emergency department.  Plan to transfer to neuro critical care for further management.  Final Clinical Impressions(s) / ED Diagnoses   Final diagnoses:  Acute intracranial hemorrhage [facility])    ED Discharge Orders    None       Quintella Reichert, MD 04/26/17 [phone]

## 2017-04-25 NOTE — ED Provider Notes (Signed)
MSE was initiated and I personally evaluated the patient and placed orders (if any) at  1613 on April 25, 2017. Pt presents with concerns for acute stroke, code stroke activated.  Pt briefly evaluated at the bridge.  Appears to be protecting her airway.  Stable to proceed to the CT scanner and proceed with urgent stroke evaluation.  Neurology is at the bridge The patient appears stable so that the remainder of the MSE may be completed by another provider.   Dorie Rank, MD 04/25/17 [phone]

## 2017-04-26 ENCOUNTER — Inpatient Hospital Stay (HOSPITAL_COMMUNITY): Payer: Medicare Other

## 2017-04-26 LAB — SODIUM
Sodium: 140 mmol/L (ref 135–145)
Sodium: 145 mmol/L (ref 135–145)
Sodium: 146 mmol/L — ABNORMAL HIGH (ref 135–145)
Sodium: 149 mmol/L — ABNORMAL HIGH (ref 135–145)

## 2017-04-26 MED ORDER — SODIUM CHLORIDE 3 % IV SOLN
INTRAVENOUS | Status: DC
  Administered 2017-04-26 (×2): 50 mL/h via INTRAVENOUS
  Filled 2017-04-26 (×6): qty 500

## 2017-04-26 NOTE — Progress Notes (Signed)
Left frontal ICH appears slightly larger on CT head. Starting 3% hypertonic saline at 50cc/hr.   Electronically signed: Dr. Kerney Elbe

## 2017-04-26 NOTE — Care Management Note (Signed)
Case Management Note  Patient Details  Name: Brianna Gregory MRN: [id number] Date of Birth: [birth year]  Subjective/Objective:   Pt admitted on 04/25/17 with Lt frontal ICH.  PTA, pt resided at [facility].                   Action/Plan: CSW consulted to assist with disposition at dc, though family does not want to pursue aggressive measures.  May proceed to comfort care, per bedside nurse.  Will follow progress.    Expected Discharge Date:                  Expected Discharge Plan:     In-House Referral:  Clinical Social Work  Discharge planning Services  CM Consult  Post Acute Care Choice:    Choice offered to:     DME Arranged:    DME Agency:     HH Arranged:    HH Agency:     Status of Service:  In process, will continue to follow  If discussed at Long Length of Stay Meetings, dates discussed:    Additional Comments:  Reinaldo Raddle, RN, BSN  Trauma/Neuro ICU Case Manager [phone]

## 2017-04-26 NOTE — Evaluation (Signed)
Clinical/Bedside Swallow Evaluation Patient Details  Name: Brianna Gregory MRN: [id number] Date of Birth: [birth year]  Today's Date: 04/26/2017 Time: SLP Start Time (ACUTE ONLY): 0830 SLP Stop Time (ACUTE ONLY): 0858 SLP Time Calculation (min) (ACUTE ONLY): 28 min  Past Medical History:  Past Medical History:  Diagnosis Date  . Arthritis   . Diverticulosis    colonoscopy 2007  . GERD (gastroesophageal reflux disease)   . Glaucoma   . HLD (hyperlipidemia)   . Hypertension   . Lumbar herniated disc   . Menopause   . Osteoporosis   . Skin cancer   . Urge incontinence   . UTI (urinary tract infection)   . Vitamin D deficiency    Past Surgical History:  Past Surgical History:  Procedure Laterality Date  . ABDOMINAL HYSTERECTOMY  1985  . APPENDECTOMY  1947  . CATARACT EXTRACTION     bilateral  . FIXATION KYPHOPLASTY LUMBAR SPINE  2005  . INGUINAL HERNIA REPAIR  1947   right  . INGUINAL HERNIA REPAIR  1971   left   HPI:  Brianna Richardsonis an 84 y.o.femalewith PMH of HTN, HLD, mild dementia who presented as a stroke alert with aphasia and right hemiplegia. CT head showed a large left frontal hemorrhage.   Assessment / Plan / Recommendation Clinical Impression  Patient presents with evidence of a severe oropharyngeal dysphagia with CN VII, XII involvement with resultant decreased oral control of bolus and overt evidence of aspiration with ice chip trials. SLP will continue to f/u for improvement at bedside.  SLP Visit Diagnosis: Dysphagia, oropharyngeal phase (R13.12)    Aspiration Risk  Severe aspiration risk    Diet Recommendation NPO;Alternative means - temporary   Medication Administration: Via alternative means    Other  Recommendations Oral Care Recommendations: Oral care QID   Follow up Recommendations (TBD)      Frequency and Duration min 3x week  2 weeks       Prognosis Prognosis for Safe Diet Advancement: Good Barriers to Reach Goals: Severity  of deficits      Swallow Study   General HPI: Brianna Richardsonis an 84 y.o.femalewith PMH of HTN, HLD, mild dementia who presented as a stroke alert with aphasia and right hemiplegia. CT head showed a large left frontal hemorrhage. Type of Study: Bedside Swallow Evaluation Previous Swallow Assessment: none Diet Prior to this Study: NPO Temperature Spikes Noted: No Respiratory Status: Room air History of Recent Intubation: No Behavior/Cognition: Alert;Requires cueing;Doesn't follow directions(global aphasia) Oral Cavity Assessment: Within Functional Limits Oral Care Completed by SLP: Yes Oral Cavity - Dentition: Adequate natural dentition Vision: Functional for self-feeding Self-Feeding Abilities: Able to feed self;Needs assist Patient Positioning: Upright in bed Baseline Vocal Quality: Low vocal intensity Volitional Cough: Cognitively unable to elicit Volitional Swallow: Unable to elicit    Oral/Motor/Sensory Function Overall Oral Motor/Sensory Function: Severe impairment Facial ROM: Reduced right;Suspected CN VII (facial) dysfunction Facial Symmetry: Abnormal symmetry right;Suspected CN VII (facial) dysfunction Facial Strength: Reduced right;Suspected CN VII (facial) dysfunction Facial Sensation: Reduced right;Suspected CN V (Trigeminal) dysfunction Lingual ROM: Reduced right;Suspected CN XII (hypoglossal) dysfunction Lingual Symmetry: Abnormal symmetry right;Suspected CN XII (hypoglossal) dysfunction Lingual Strength: Reduced;Suspected CN XII (hypoglossal) dysfunction Velum: (unable to assess) Mandible: Within Functional Limits   Ice Chips Ice chips: Impaired Presentation: Spoon Oral Phase Impairments: Reduced labial seal;Reduced lingual movement/coordination;Impaired mastication Oral Phase Functional Implications: Right anterior spillage;Left anterior spillage;Prolonged oral transit;Oral residue Pharyngeal Phase Impairments: Suspected delayed Swallow;Throat Clearing -  Immediate;Throat Clearing - Delayed;Cough - Delayed  Thin Liquid Thin Liquid: Not tested    Nectar Thick Nectar Thick Liquid: Not tested   Honey Thick Honey Thick Liquid: Not tested   Puree Puree: Not tested   Solid   Alexys Lobello MA, CCC-SLP [phone]    Solid: Not tested        Hakan Nudelman Meryl 04/26/2017,9:03 AM

## 2017-04-26 NOTE — Progress Notes (Signed)
STROKE TEAM PROGRESS NOTE   SUBJECTIVE (INTERVAL HISTORY) Her 2 daughters are at the bedside.  Repeat CT in a.m. showed increased hematoma from prior CT.  I had long discussion with daughters at bedside, updated pt current condition, treatment plan and potential prognosis. They expressed understanding and appreciation.  They stated that patient would not want poor quality of life. They are leaning towards comfort care, but would like to discuss with other family members.   OBJECTIVE Temp:  [95.5 F (35.3 C)-99.3 F (37.4 C)] 99.3 F (37.4 C) (01/31 2329) Pulse Rate:  [81-125] 91 (01/31 2315) Cardiac Rhythm: Normal sinus rhythm;Sinus tachycardia (01/31 2000) Resp:  [13-26] 18 (01/31 2315) BP: (102-145)/(46-83) 124/50 (01/31 2315) SpO2:  [94 %-100 %] 97 % (01/31 2315)  Recent Labs  Lab 04/25/17 1616  GLUCAP 145*   Recent Labs  Lab 04/25/17 1625 04/25/17 1626 04/26/17 0419 04/26/17 0957 04/26/17 1428 04/26/17 1941  NA 144 141 140 145 146* 149*  K 3.9 4.0  --   --   --   --   CL 106 107  --   --   --   --   CO2  --  24  --   --   --   --   GLUCOSE 150* 153*  --   --   --   --   BUN 29* 29*  --   --   --   --   CREATININE 0.80 0.83  --   --   --   --   CALCIUM  --  8.8*  --   --   --   --    Recent Labs  Lab 04/25/17 1626  AST 27  ALT 12*  ALKPHOS 38  BILITOT 0.4  PROT 6.0*  ALBUMIN 3.5   Recent Labs  Lab 04/25/17 1625 04/25/17 1626  WBC  --  8.5  NEUTROABS  --  5.6  HGB 13.6 12.9  HCT 40.0 40.5  MCV  --  94.6  PLT  --  168   No results for input(s): CKTOTAL, CKMB, CKMBINDEX, TROPONINI in the last 168 hours. Recent Labs    04/25/17 1626  LABPROT 13.3  INR 1.02   No results for input(s): COLORURINE, LABSPEC, PHURINE, GLUCOSEU, HGBUR, BILIRUBINUR, KETONESUR, PROTEINUR, UROBILINOGEN, NITRITE, LEUKOCYTESUR in the last 72 hours.  Invalid input(s): APPERANCEUR     Component Value Date/Time   CHOL 228 (H) 01/29/2012 1111   TRIG 122 01/29/2012 1111   HDL  52 01/29/2012 1111   CHOLHDL 4.4 01/29/2012 1111   VLDL 24 01/29/2012 1111   LDLCALC 152 (H) 01/29/2012 1111   No results found for: HGBA1C No results found for: LABOPIA, COCAINSCRNUR, LABBENZ, AMPHETMU, THCU, LABBARB  No results for input(s): ETH in the last 168 hours.  I have personally reviewed the radiological images below and agree with the radiology interpretations.  Ct Angio Head W Or Wo Contrast  Result Date: 04/25/2017 CLINICAL DATA:  Code stroke. Initial evaluation for acute stroke, right-sided deficits. EXAM: CT ANGIOGRAPHY HEAD TECHNIQUE: Multidetector CT imaging of the head was performed using the standard protocol during bolus administration of intravenous contrast. Multiplanar CT image reconstructions and MIPs were obtained to evaluate the vascular anatomy. CONTRAST:  50 cc of Isovue 370. COMPARISON:  Prior CT from 09/24/2015. FINDINGS: CT HEAD Brain: Large intraparenchymal hematoma measuring 7.9 x 3.4 x 5.1 cm present at the anterior left cerebral convexity (estimated volume 68 cc). Mild surrounding low-density vasogenic edema with regional mass effect.  Trace 3 mm left-to-right shift. No hydrocephalus or ventricular trapping. Basilar cisterns remain patent. Associated small volume subarachnoid hemorrhage noted within the adjacent left frontal lobe possible extra-axial extension with small amount of subdural blood overlying the adjacent left cerebral convexity. No other acute intracranial hemorrhage. No other acute large vessel territory infarct. Moderate atrophy with chronic small vessel ischemic disease. Small remote lacunar infarct noted within the left basal ganglia. No mass lesion. Vascular: No hyperdense vessel. Scattered vascular calcifications noted within the carotid siphons. Skull: Scalp soft tissues and calvarium within normal limits. Sinuses: Few scattered subcentimeter retention cyst noted within the right maxillary and left sphenoid sinuses. Mild mucosal thickening within  the ethmoidal air cells on the left. Paranasal sinuses are otherwise clear. Torus palatini partially visualized. Mastoid air cells are clear. Cyst noted. Paranasal sinuses Orbits: Globes and orbital soft tissues within normal limits. Patient status post lens extraction bilaterally. CTA HEAD Anterior circulation: Visualized distal cervical segments of the internal carotid arteries are well opacified and symmetric bilaterally. Petrous segments widely patent bilaterally. Mild scattered atheromatous plaque within the cavernous ICAs without significant stenosis. Mild atheromatous narrowing at the supraclinoid left ICA. Supraclinoid right ICA widely patent. ICA termini widely patent. A1 segments well opacified and symmetric. Patent anterior communicating artery. Anterior cerebral arteries widely patent to their distal aspects without stenosis. M1 segments widely patent without stenosis or occlusion. Normal MCA bifurcations. Distal MCA branches well perfused and fairly symmetric. Mild mass effect on the distal left MCA branches due to the large intraparenchymal hematoma. No vascular out formation or other finding underlying the hematoma identified. Posterior circulation: Left vertebral artery dominant and widely patent to the vertebrobasilar junction. Right vertebral artery hypoplastic with multifocal atheromatous irregularity with mild right V4 stenosis. Left PICA patent. Right PICA not well visualized. Basilar artery mildly irregular but widely patent to its distal aspect without stenosis. Superior cerebral arteries patent proximally. Both of the posterior cerebral arteries largely supplied via the basilar and are well perfused to their distal aspects without high-grade stenosis. Small bilateral posterior communicating arteries noted. Venous sinuses: Grossly patent, although not well evaluated due to arterial timing of the contrast bolus. Anatomic variants: None significant. No vascular abnormality seen underlying the  left frontal hematoma. Delayed phase: Not performed. IMPRESSION: 1. 7.9 x 3.4 x 5.1 cm acute intraparenchymal hematoma positioned within the anterior left frontal lobe (estimated volume 68 cc). Associated regional mass effect with trace 3 mm left-to-right shift. Primary differential considerations include hypertensive bleed or possibly amyloid angiopathy. 2. Underlying moderate cerebral atrophy with chronic small vessel ischemic disease. 3. Negative intracranial CTA. No vascular abnormality seen underlying the intraparenchymal hematoma. No large vessel occlusion. 4. Mild for age intracranial atherosclerosis as above. No high-grade or correctable stenosis. Critical Value/emergent results were called by telephone at the time of interpretation on 04/25/2017 at 4:40 pm to Dr. Samara Snide , who verbally acknowledged these results. Electronically Signed   By: Jeannine Boga M.D.   On: 04/25/2017 17:25   Ct Head Wo Contrast  Result Date: 04/26/2017 CLINICAL DATA:  84 y/o  F; follow-up of acute intracranial hematoma. EXAM: CT HEAD WITHOUT CONTRAST TECHNIQUE: Contiguous axial images were obtained from the base of the skull through the vertex without intravenous contrast. COMPARISON:  04/25/2017 CT head FINDINGS: Brain: Acute hemorrhage within the left frontal lobe is increased in size measuring 8.7 x 4.2 x 5.2 cm (volume = 99 cm^3, previously 68 cc) (AP x ML x CC series 5, image 36 and series 6, image  39). 7 mm left-to-right midline shift (series 3, image 19). Mild left-to-right subfalcine herniation. Partial effacement of the left lateral ventricle. Increasing small volume subarachnoid hemorrhage over the left cerebral convexity. No new acute intracranial hemorrhage, stroke, or mass effect. No effacement of basilar cisterns or downward herniation. Stable background of moderate parenchymal volume loss and chronic microvascular ischemic changes of the brain. Vascular: Mild calcific atherosclerosis of carotid  siphons. No hyperdense vessel. Skull: Normal. Negative for fracture or focal lesion. Sinuses/Orbits: No acute finding. Other: None. IMPRESSION: 1. Increased size of left frontal hematoma 99 cc, previously 68 cc. 2. Increased small volume subarachnoid hemorrhage over left frontal convexity. 3. Increase edema and mass effect with left-to-right midline shift of 7 mm. Mild left-to-right subfalcine herniation. No downward herniation. These results will be called to the ordering clinician or representative by the Radiologist Assistant, and communication documented in the PACS or [facility] Dashboard. Electronically Signed   By: Kristine Garbe M.D.   On: 04/26/2017 02:25   Ct Head Code Stroke Wo Contrast  Result Date: 04/25/2017 CLINICAL DATA:  Code stroke. Initial evaluation for acute stroke, right-sided deficits. EXAM: CT ANGIOGRAPHY HEAD TECHNIQUE: Multidetector CT imaging of the head was performed using the standard protocol during bolus administration of intravenous contrast. Multiplanar CT image reconstructions and MIPs were obtained to evaluate the vascular anatomy. CONTRAST:  50 cc of Isovue 370. COMPARISON:  Prior CT from 09/24/2015. FINDINGS: CT HEAD Brain: Large intraparenchymal hematoma measuring 7.9 x 3.4 x 5.1 cm present at the anterior left cerebral convexity (estimated volume 68 cc). Mild surrounding low-density vasogenic edema with regional mass effect. Trace 3 mm left-to-right shift. No hydrocephalus or ventricular trapping. Basilar cisterns remain patent. Associated small volume subarachnoid hemorrhage noted within the adjacent left frontal lobe possible extra-axial extension with small amount of subdural blood overlying the adjacent left cerebral convexity. No other acute intracranial hemorrhage. No other acute large vessel territory infarct. Moderate atrophy with chronic small vessel ischemic disease. Small remote lacunar infarct noted within the left basal ganglia. No mass lesion. Vascular:  No hyperdense vessel. Scattered vascular calcifications noted within the carotid siphons. Skull: Scalp soft tissues and calvarium within normal limits. Sinuses: Few scattered subcentimeter retention cyst noted within the right maxillary and left sphenoid sinuses. Mild mucosal thickening within the ethmoidal air cells on the left. Paranasal sinuses are otherwise clear. Torus palatini partially visualized. Mastoid air cells are clear. Cyst noted. Paranasal sinuses Orbits: Globes and orbital soft tissues within normal limits. Patient status post lens extraction bilaterally. CTA HEAD Anterior circulation: Visualized distal cervical segments of the internal carotid arteries are well opacified and symmetric bilaterally. Petrous segments widely patent bilaterally. Mild scattered atheromatous plaque within the cavernous ICAs without significant stenosis. Mild atheromatous narrowing at the supraclinoid left ICA. Supraclinoid right ICA widely patent. ICA termini widely patent. A1 segments well opacified and symmetric. Patent anterior communicating artery. Anterior cerebral arteries widely patent to their distal aspects without stenosis. M1 segments widely patent without stenosis or occlusion. Normal MCA bifurcations. Distal MCA branches well perfused and fairly symmetric. Mild mass effect on the distal left MCA branches due to the large intraparenchymal hematoma. No vascular out formation or other finding underlying the hematoma identified. Posterior circulation: Left vertebral artery dominant and widely patent to the vertebrobasilar junction. Right vertebral artery hypoplastic with multifocal atheromatous irregularity with mild right V4 stenosis. Left PICA patent. Right PICA not well visualized. Basilar artery mildly irregular but widely patent to its distal aspect without stenosis. Superior cerebral arteries patent  proximally. Both of the posterior cerebral arteries largely supplied via the basilar and are well perfused to  their distal aspects without high-grade stenosis. Small bilateral posterior communicating arteries noted. Venous sinuses: Grossly patent, although not well evaluated due to arterial timing of the contrast bolus. Anatomic variants: None significant. No vascular abnormality seen underlying the left frontal hematoma. Delayed phase: Not performed. IMPRESSION: 1. 7.9 x 3.4 x 5.1 cm acute intraparenchymal hematoma positioned within the anterior left frontal lobe (estimated volume 68 cc). Associated regional mass effect with trace 3 mm left-to-right shift. Primary differential considerations include hypertensive bleed or possibly amyloid angiopathy. 2. Underlying moderate cerebral atrophy with chronic small vessel ischemic disease. 3. Negative intracranial CTA. No vascular abnormality seen underlying the intraparenchymal hematoma. No large vessel occlusion. 4. Mild for age intracranial atherosclerosis as above. No high-grade or correctable stenosis. Critical Value/emergent results were called by telephone at the time of interpretation on 04/25/2017 at 4:40 pm to Dr. Samara Snide , who verbally acknowledged these results. Electronically Signed   By: Jeannine Boga M.D.   On: 04/25/2017 17:25    PHYSICAL EXAM  Temp:  [95.5 F (35.3 C)-99.3 F (37.4 C)] 99.3 F (37.4 C) (01/31 2329) Pulse Rate:  [81-125] 91 (01/31 2315) Resp:  [13-26] 18 (01/31 2315) BP: (102-145)/(46-83) 124/50 (01/31 2315) SpO2:  [94 %-100 %] 97 % (01/31 2315)  General - Well nourished, well developed, mildly lethargic.  Ophthalmologic - fundi not visualized due to noncooperation.  Cardiovascular - Regular rate and rhythm with no murmur.  Neuro -mildly lethargic, but awake alert, eyes open, global aphasia, no speech output, not following commands.  Left gaze preference, barely cross midline, right-sided neglect.  PERRL, blinking to visual threat on the left.  Right facial droop, tongue examination not cooperative.  Left upper  extremity 4/5, spontaneous movement.  Bilateral lower extremity withdrawal on pain stimulation.  Right upper extremity flaccid.  DTR 1+, Babinski negative. Sensation, coordination and gait not tested.   ASSESSMENT/PLAN [name] is a 84 y.o. female with history of hyperlipidemia, hypertension, dementia admitted for aphasia and right-sided weakness. No tPA given due to [place].    ICH:  left frontal large ICH, hypertensive versus cerebral amyloid angiopathy  Resultant RUE plegia, right neglect, global aphasia  CT head large left frontal ICH  Repeat CT head showed mild enlargement of left frontal ICH  CTA head no AVM or aneurysm  SCDs for VTE prophylaxis  Diet NPO time specified  Fall precautions   aspirin 81 mg daily prior to admission, now on No antithrombotic  Due to [place]  Ongoing aggressive stroke risk factor management  Therapy recommendations: Pending  Disposition: Pending  Had long discussion with daughters at bedside, they are leaning towards to comfort care due to poor prognosis, however, they will discuss with other family members before final decision.  Cerebral edema  Repeat CT showed mild enlargement of left frontal ICH  Subfalcine herniation  On 3% saline via peripheral line  Sodium goal 152-155  Sodium every 6 hours  Hypertension Stable BP goal < 160 On Cardene drip  Long term BP goal normotensive  Hyperlipidemia  Home meds: Crestor  Continue statin once p.o. access  Other Stroke Risk Factors  Advanced age  Other Active Problems  Dementia   Hospital day # 1  This patient is critically ill due to left frontal large ICH, cerebral edema, hypertensive emergency and at significant risk of neurological worsening, death form stroke, hemorrhagic conversion, brain herniation, heart failure. This  patient's care requires constant monitoring of vital signs, hemodynamics, respiratory and cardiac monitoring, review of multiple databases,  neurological assessment, discussion with family, other specialists and medical decision making of high complexity. I had long discussion with daughters at bedside, updated pt current condition, treatment plan and potential prognosis. They expressed understanding and appreciation. I spent 40 minutes of neurocritical care time in the care of this patient.   Rosalin Hawking, MD PhD Stroke Neurology 04/26/2017 11:30 PM    To contact Stroke Continuity provider, please refer to [email or web address] After hours, contact General Neurology

## 2017-04-26 NOTE — Evaluation (Signed)
Speech Language Pathology Evaluation Patient Details Name: Brianna Gregory MRN: [id number] DOB: [birth year] Today's Date: 04/26/2017 Time: 7001-7494 SLP Time Calculation (min) (ACUTE ONLY): 28 min  Problem List:  Patient Active Problem List   Diagnosis Date Noted  . ICH (intracerebral hemorrhage) ([place]) 04/25/2017  . Left knee pain 09/30/2014  . Toe pain, left 09/24/2014  . Frequent UTI 04/28/2014  . Mild cognitive disorder 03/06/2014  . BP (high blood pressure) 03/03/2014  . Right knee injury 07/17/2013  . Right ankle pain 06/12/2012  . Right knee pain 06/12/2012  . Labial lesion 06/04/2012  . Paresthesias/numbness 06/04/2012  . Pain in joint, ankle and foot 06/04/2012  . GERD (gastroesophageal reflux disease) 01/29/2012  . Other and unspecified hyperlipidemia 01/29/2012  . Numbness of toes 01/29/2012  . Osteoporosis 01/29/2012  . Back pain 03/14/2011  . History of compression fracture of spine 03/14/2011  . Headache(784.0) 02/01/2011  . Rectal polyp 01/12/2011  . Heel spur 12/22/2010  . Arthritis   . Skin cancer   . Menopause   . Glaucoma   . Lumbar herniated disc   . Urge incontinence   . Diverticulosis    Past Medical History:  Past Medical History:  Diagnosis Date  . Arthritis   . Diverticulosis    colonoscopy 2007  . GERD (gastroesophageal reflux disease)   . Glaucoma   . HLD (hyperlipidemia)   . Hypertension   . Lumbar herniated disc   . Menopause   . Osteoporosis   . Skin cancer   . Urge incontinence   . UTI (urinary tract infection)   . Vitamin D deficiency    Past Surgical History:  Past Surgical History:  Procedure Laterality Date  . ABDOMINAL HYSTERECTOMY  1985  . APPENDECTOMY  1947  . CATARACT EXTRACTION     bilateral  . FIXATION KYPHOPLASTY LUMBAR SPINE  2005  . INGUINAL HERNIA REPAIR  1947   right  . INGUINAL HERNIA REPAIR  1971   left   HPI:  Brianna Richardsonis an 84 y.o.femalewith PMH of HTN, HLD, mild dementia who  presented as a stroke alert with aphasia and right hemiplegia. CT head showed a large left frontal hemorrhage.   Assessment / Plan / Recommendation Clinical Impression  Patient presents with global aphasia with severe impact on both expressive and receptive language skills. Able to follow basic 1-step commands in less than 25% of opportunities and with max contextual cues. Limited verbal output at the word level with max SLP demonstration/visual and tactile cueing, unable to r/o apraxic component at this time. Appearing to present with right visual field cut (can direct attention to right visual field with cueing). Suspect in addition as exacerbation and potentially new cognitive deficits as well which are difficult to fully evaluate given severity of communication impairments at this time. Education complete with family present. Patient will benefit from skilled SLP f/u to maximize ability to communicate basic needs and wants and participate in basic ADLs.     SLP Assessment  SLP Recommendation/Assessment: Patient needs continued Speech Lanaguage Pathology Services SLP Visit Diagnosis: Aphasia (R47.01)    Follow Up Recommendations  (TBD)    Frequency and Duration min 3x week  2 weeks      SLP Evaluation Cognition  Overall Cognitive Status: Difficult to assess(given extent of aphasia however appears impaired) Arousal/Alertness: Awake/alert Orientation Level: Other (comment)(pt aphasic)       Comprehension  Auditory Comprehension Overall Auditory Comprehension: Impaired Yes/No Questions: Impaired Basic Biographical Questions: 0-25% accurate Commands: Impaired One  Step Basic Commands: 0-24% accurate Visual Recognition/Discrimination Discrimination: Exceptions to [facility] Common Objects: Unable to indentify Reading Comprehension Reading Status: Not tested    Expression Expression Primary Mode of Expression: Verbal Verbal Expression Overall Verbal Expression: Impaired Initiation:  Impaired Level of Generative/Spontaneous Verbalization: Word Repetition: Impaired Level of Impairment: Word level Naming: Impairment Responsive: 0-25% accurate Confrontation: Impaired Common Objects: Unable to indentify Convergent: 0-24% accurate Divergent: 0-24% accurate Verbal Errors: Neologisms Effective Techniques: Other (Comment)(demonstration and tactile cueing) Written Expression Dominant Hand: Right Written Expression: Not tested   Oral / Motor  Oral Motor/Sensory Function Overall Oral Motor/Sensory Function: Severe impairment Facial ROM: Reduced right;Suspected CN VII (facial) dysfunction Facial Symmetry: Abnormal symmetry right;Suspected CN VII (facial) dysfunction Facial Strength: Reduced right;Suspected CN VII (facial) dysfunction Facial Sensation: Reduced right;Suspected CN V (Trigeminal) dysfunction Lingual ROM: Reduced right;Suspected CN XII (hypoglossal) dysfunction Lingual Symmetry: Abnormal symmetry right;Suspected CN XII (hypoglossal) dysfunction Lingual Strength: Reduced;Suspected CN XII (hypoglossal) dysfunction Velum: (unable to assess) Mandible: Within Functional Limits Motor Speech Overall Motor Speech: Other (comment)(TBD as verbal communication increases, likely impaired)   [place], [place] ([place] 04/26/2017, 9:10 AM

## 2017-04-26 NOTE — Progress Notes (Signed)
PT Cancellation Note  Patient Details Name: Brianna Gregory MRN: [id number] DOB: May 06, 1926   Cancelled Treatment:    Reason Eval/Treat Not Completed: Medical issues which prohibited therapy.  Pt remains on bed rest.  RN page physician and confirmed to stay on bed rest, and may pursue full comfort care. PT to check on tomorrow.  Thanks,    Barbarann Ehlers. Thorn Demas, PT, DPT [phone]   04/26/2017, 1:49 PM

## 2017-04-26 NOTE — Progress Notes (Signed)
OT Cancellation Note  Patient Details Name: Brianna Gregory MRN: [id number] DOB: [birth year]   Cancelled Treatment:    Reason Eval/Treat Not Completed: Patient not medically ready  [place], OT/L  [phone] 04/26/2017 04/26/2017, 8:34 AM

## 2017-04-27 ENCOUNTER — Inpatient Hospital Stay (HOSPITAL_COMMUNITY): Payer: Medicare Other

## 2017-04-27 DIAGNOSIS — I619 Nontraumatic intracerebral hemorrhage, unspecified: Secondary | ICD-10-CM

## 2017-04-27 DIAGNOSIS — R414 Neurologic neglect syndrome: Secondary | ICD-10-CM

## 2017-04-27 DIAGNOSIS — Z515 Encounter for palliative care: Secondary | ICD-10-CM

## 2017-04-27 DIAGNOSIS — I629 Nontraumatic intracranial hemorrhage, unspecified: Secondary | ICD-10-CM

## 2017-04-27 DIAGNOSIS — Z7189 Other specified counseling: Secondary | ICD-10-CM

## 2017-04-27 LAB — GLUCOSE, CAPILLARY: GLUCOSE-CAPILLARY: 184 mg/dL — AB (ref 65–99)

## 2017-04-27 LAB — PHOSPHORUS: PHOSPHORUS: 1.5 mg/dL — AB (ref 2.5–4.6)

## 2017-04-27 LAB — MAGNESIUM: Magnesium: 2.6 mg/dL — ABNORMAL HIGH (ref 1.7–2.4)

## 2017-04-27 MED ORDER — PRO-STAT SUGAR FREE PO LIQD
30.0000 mL | Freq: Every day | ORAL | Status: DC
  Administered 2017-04-27 – 2017-04-30 (×4): 30 mL
  Filled 2017-04-27 (×4): qty 30

## 2017-04-27 MED ORDER — SODIUM CHLORIDE 0.9 % IV SOLN
INTRAVENOUS | Status: DC
  Administered 2017-04-27: 18:00:00 via INTRAVENOUS

## 2017-04-27 MED ORDER — JEVITY 1.2 CAL PO LIQD
1000.0000 mL | ORAL | Status: DC
  Administered 2017-04-27: 1000 mL
  Administered 2017-04-28: 17:00:00
  Administered 2017-04-29: 1000 mL
  Filled 2017-04-27 (×5): qty 1000

## 2017-04-27 MED ORDER — JEVITY 1.2 CAL PO LIQD
1000.0000 mL | ORAL | Status: DC
  Filled 2017-04-27: qty 1000

## 2017-04-27 MED ORDER — LABETALOL HCL 5 MG/ML IV SOLN
10.0000 mg | INTRAVENOUS | Status: DC | PRN
  Administered 2017-04-28: 10 mg via INTRAVENOUS
  Administered 2017-04-28: 20 mg via INTRAVENOUS
  Administered 2017-04-28: 10 mg via INTRAVENOUS
  Filled 2017-04-27 (×4): qty 4

## 2017-04-27 NOTE — Progress Notes (Signed)
Cortrak Tube Team Note:  Consult received to place a Cortrak feeding tube.   A 10 F Cortrak tube was placed in the R nare and secured with a nasal bridle at 87 cm. Per the Cortrak monitor reading the tube tip is postpyloric, approximately D1   No x-ray is required. RN may begin using tube.   If the tube becomes dislodged please keep the tube and contact the Cortrak team at [email or web address] (password [email or web address]) for replacement.  If after hours and replacement cannot be delayed, place a NG tube and confirm placement with an abdominal x-ray.   Burtis Junes RD, LDN, CNSC Clinical Nutrition Pager: [phone] 04/27/2017 6:15 PM

## 2017-04-27 NOTE — Progress Notes (Signed)
[facility] can take patient into skilled nursing when stable and can manage a NG tube if pt needs at time of [place], [place] Social Worker [phone]

## 2017-04-27 NOTE — Progress Notes (Signed)
PT Cancellation Note/ Discharge  Patient Details Name: Brianna Gregory MRN: [id number] DOB: 17-May-1926   Cancelled Treatment:    Reason Eval/Treat Not Completed: Medical issues which prohibited therapy(pt not medically ready on bedrest with goal of comfort. Will sign off please reorder as appropriate)   Taheerah Guldin B Spenser [place] 04/27/2017, 10:07 AM  Elwyn Reach, [place]

## 2017-04-27 NOTE — Progress Notes (Signed)
  Speech Language Pathology Treatment: Dysphagia;Cognitive-Linquistic  Patient Details Name: Brianna Gregory MRN: [id number] DOB: December 20, 1926 Today's Date: 04/27/2017 Time: 9509-3267 SLP Time Calculation (min) (ACUTE ONLY): 35 min  Assessment / Plan / Recommendation Clinical Impression  Pt more alert and participatory today.  With max tactile/visual/verbal cues initially, pt sang in unison with clinician, with cues faded to sentence-completion.  She was able to anticipate and produce lyrics with dysarthric output but the words were intelligible.  Verbal perseverations noted, but pt able to shift to target output with verbal cues.   Presented with active mastication of ice chips and applesauce; swallow likely delayed, with +s/s of aspiration after consumption of ice chips/thin liquids.  Discussed situation with son and daughter - will proceed with MBS this afternoon to help determine safest possible PO diet and gather more information for future decision-making.  Scheduled for 2 pm today.    HPI HPI: Brianna Richardsonis an 84 y.o.femalewith PMH of HTN, HLD, mild dementia who presented as a stroke alert with aphasia and right hemiplegia. CT head showed a large left frontal hemorrhage.      SLP Plan  Continue with current plan of care;MBS       Recommendations  Diet recommendations: NPO                Oral Care Recommendations: Oral care QID Follow up Recommendations: Other (comment)(tba) SLP Visit Diagnosis: Dysphagia, unspecified (R13.10);Aphasia (R47.01) Plan: Continue with current plan of care;MBS       GO                Juan Quam Laurice 04/27/2017, 12:24 PM  Kineta Fudala L. Tivis Ringer, [place] CCC/SLP Pager [phone]

## 2017-04-27 NOTE — Progress Notes (Signed)
STROKE TEAM PROGRESS NOTE   SUBJECTIVE (INTERVAL HISTORY) Her 2 daughters and son are at the bedside.  Pt seems more awake alert today and able to track objects to the right. Still has right neglect and hemianopia, right hemiplegia. Palliative care on board and will continue following. 3% saline off due to extravasation. BP stable, off cardene.   OBJECTIVE Temp:  [95.5 F (35.3 C)-100.5 F (38.1 C)] 98.8 F (37.1 C) (02/01 0800) Pulse Rate:  [88-125] 108 (02/01 0900) Cardiac Rhythm: Normal sinus rhythm;Sinus tachycardia (02/01 0800) Resp:  [13-28] 19 (02/01 0900) BP: (102-145)/(46-77) 131/62 (02/01 0900) SpO2:  [88 %-99 %] 97 % (02/01 0900)  Recent Labs  Lab 04/25/17 1616  GLUCAP 145*   Recent Labs  Lab 04/25/17 1625 04/25/17 1626 04/26/17 0419 04/26/17 0957 04/26/17 1428 04/26/17 1941  NA 144 141 140 145 146* 149*  K 3.9 4.0  --   --   --   --   CL 106 107  --   --   --   --   CO2  --  24  --   --   --   --   GLUCOSE 150* 153*  --   --   --   --   BUN 29* 29*  --   --   --   --   CREATININE 0.80 0.83  --   --   --   --   CALCIUM  --  8.8*  --   --   --   --    Recent Labs  Lab 04/25/17 1626  AST 27  ALT 12*  ALKPHOS 38  BILITOT 0.4  PROT 6.0*  ALBUMIN 3.5   Recent Labs  Lab 04/25/17 1625 04/25/17 1626  WBC  --  8.5  NEUTROABS  --  5.6  HGB 13.6 12.9  HCT 40.0 40.5  MCV  --  94.6  PLT  --  168   No results for input(s): CKTOTAL, CKMB, CKMBINDEX, TROPONINI in the last 168 hours. Recent Labs    04/25/17 1626  LABPROT 13.3  INR 1.02   No results for input(s): COLORURINE, LABSPEC, PHURINE, GLUCOSEU, HGBUR, BILIRUBINUR, KETONESUR, PROTEINUR, UROBILINOGEN, NITRITE, LEUKOCYTESUR in the last 72 hours.  Invalid input(s): APPERANCEUR     Component Value Date/Time   CHOL 228 (H) 01/29/2012 1111   TRIG 122 01/29/2012 1111   HDL 52 01/29/2012 1111   CHOLHDL 4.4 01/29/2012 1111   VLDL 24 01/29/2012 1111   LDLCALC 152 (H) 01/29/2012 1111   No results  found for: HGBA1C No results found for: LABOPIA, COCAINSCRNUR, LABBENZ, AMPHETMU, THCU, LABBARB  No results for input(s): ETH in the last 168 hours.  I have personally reviewed the radiological images below and agree with the radiology interpretations.  Ct Angio Head W Or Wo Contrast  Result Date: 04/25/2017 CLINICAL DATA:  Code stroke. Initial evaluation for acute stroke, right-sided deficits. EXAM: CT ANGIOGRAPHY HEAD TECHNIQUE: Multidetector CT imaging of the head was performed using the standard protocol during bolus administration of intravenous contrast. Multiplanar CT image reconstructions and MIPs were obtained to evaluate the vascular anatomy. CONTRAST:  50 cc of Isovue 370. COMPARISON:  Prior CT from 09/24/2015. FINDINGS: CT HEAD Brain: Large intraparenchymal hematoma measuring 7.9 x 3.4 x 5.1 cm present at the anterior left cerebral convexity (estimated volume 68 cc). Mild surrounding low-density vasogenic edema with regional mass effect. Trace 3 mm left-to-right shift. No hydrocephalus or ventricular trapping. Basilar cisterns remain patent. Associated small volume subarachnoid  hemorrhage noted within the adjacent left frontal lobe possible extra-axial extension with small amount of subdural blood overlying the adjacent left cerebral convexity. No other acute intracranial hemorrhage. No other acute large vessel territory infarct. Moderate atrophy with chronic small vessel ischemic disease. Small remote lacunar infarct noted within the left basal ganglia. No mass lesion. Vascular: No hyperdense vessel. Scattered vascular calcifications noted within the carotid siphons. Skull: Scalp soft tissues and calvarium within normal limits. Sinuses: Few scattered subcentimeter retention cyst noted within the right maxillary and left sphenoid sinuses. Mild mucosal thickening within the ethmoidal air cells on the left. Paranasal sinuses are otherwise clear. Torus palatini partially visualized. Mastoid air  cells are clear. Cyst noted. Paranasal sinuses Orbits: Globes and orbital soft tissues within normal limits. Patient status post lens extraction bilaterally. CTA HEAD Anterior circulation: Visualized distal cervical segments of the internal carotid arteries are well opacified and symmetric bilaterally. Petrous segments widely patent bilaterally. Mild scattered atheromatous plaque within the cavernous ICAs without significant stenosis. Mild atheromatous narrowing at the supraclinoid left ICA. Supraclinoid right ICA widely patent. ICA termini widely patent. A1 segments well opacified and symmetric. Patent anterior communicating artery. Anterior cerebral arteries widely patent to their distal aspects without stenosis. M1 segments widely patent without stenosis or occlusion. Normal MCA bifurcations. Distal MCA branches well perfused and fairly symmetric. Mild mass effect on the distal left MCA branches due to the large intraparenchymal hematoma. No vascular out formation or other finding underlying the hematoma identified. Posterior circulation: Left vertebral artery dominant and widely patent to the vertebrobasilar junction. Right vertebral artery hypoplastic with multifocal atheromatous irregularity with mild right V4 stenosis. Left PICA patent. Right PICA not well visualized. Basilar artery mildly irregular but widely patent to its distal aspect without stenosis. Superior cerebral arteries patent proximally. Both of the posterior cerebral arteries largely supplied via the basilar and are well perfused to their distal aspects without high-grade stenosis. Small bilateral posterior communicating arteries noted. Venous sinuses: Grossly patent, although not well evaluated due to arterial timing of the contrast bolus. Anatomic variants: None significant. No vascular abnormality seen underlying the left frontal hematoma. Delayed phase: Not performed. IMPRESSION: 1. 7.9 x 3.4 x 5.1 cm acute intraparenchymal hematoma  positioned within the anterior left frontal lobe (estimated volume 68 cc). Associated regional mass effect with trace 3 mm left-to-right shift. Primary differential considerations include hypertensive bleed or possibly amyloid angiopathy. 2. Underlying moderate cerebral atrophy with chronic small vessel ischemic disease. 3. Negative intracranial CTA. No vascular abnormality seen underlying the intraparenchymal hematoma. No large vessel occlusion. 4. Mild for age intracranial atherosclerosis as above. No high-grade or correctable stenosis. Critical Value/emergent results were called by telephone at the time of interpretation on 04/25/2017 at 4:40 pm to Dr. Samara Snide , who verbally acknowledged these results. Electronically Signed   By: Jeannine Boga M.D.   On: 04/25/2017 17:25   Ct Head Wo Contrast  Result Date: 04/26/2017 CLINICAL DATA:  84 y/o  F; follow-up of acute intracranial hematoma. EXAM: CT HEAD WITHOUT CONTRAST TECHNIQUE: Contiguous axial images were obtained from the base of the skull through the vertex without intravenous contrast. COMPARISON:  04/25/2017 CT head FINDINGS: Brain: Acute hemorrhage within the left frontal lobe is increased in size measuring 8.7 x 4.2 x 5.2 cm (volume = 99 cm^3, previously 68 cc) (AP x ML x CC series 5, image 36 and series 6, image 39). 7 mm left-to-right midline shift (series 3, image 19). Mild left-to-right subfalcine herniation. Partial effacement of the  left lateral ventricle. Increasing small volume subarachnoid hemorrhage over the left cerebral convexity. No new acute intracranial hemorrhage, stroke, or mass effect. No effacement of basilar cisterns or downward herniation. Stable background of moderate parenchymal volume loss and chronic microvascular ischemic changes of the brain. Vascular: Mild calcific atherosclerosis of carotid siphons. No hyperdense vessel. Skull: Normal. Negative for fracture or focal lesion. Sinuses/Orbits: No acute finding.  Other: None. IMPRESSION: 1. Increased size of left frontal hematoma 99 cc, previously 68 cc. 2. Increased small volume subarachnoid hemorrhage over left frontal convexity. 3. Increase edema and mass effect with left-to-right midline shift of 7 mm. Mild left-to-right subfalcine herniation. No downward herniation. These results will be called to the ordering clinician or representative by the Radiologist Assistant, and communication documented in the PACS or zVision Dashboard. Electronically Signed   By: Kristine Garbe M.D.   On: 04/26/2017 02:25   Ct Head Code Stroke Wo Contrast  Result Date: 04/25/2017 CLINICAL DATA:  Code stroke. Initial evaluation for acute stroke, right-sided deficits. EXAM: CT ANGIOGRAPHY HEAD TECHNIQUE: Multidetector CT imaging of the head was performed using the standard protocol during bolus administration of intravenous contrast. Multiplanar CT image reconstructions and MIPs were obtained to evaluate the vascular anatomy. CONTRAST:  50 cc of Isovue 370. COMPARISON:  Prior CT from 09/24/2015. FINDINGS: CT HEAD Brain: Large intraparenchymal hematoma measuring 7.9 x 3.4 x 5.1 cm present at the anterior left cerebral convexity (estimated volume 68 cc). Mild surrounding low-density vasogenic edema with regional mass effect. Trace 3 mm left-to-right shift. No hydrocephalus or ventricular trapping. Basilar cisterns remain patent. Associated small volume subarachnoid hemorrhage noted within the adjacent left frontal lobe possible extra-axial extension with small amount of subdural blood overlying the adjacent left cerebral convexity. No other acute intracranial hemorrhage. No other acute large vessel territory infarct. Moderate atrophy with chronic small vessel ischemic disease. Small remote lacunar infarct noted within the left basal ganglia. No mass lesion. Vascular: No hyperdense vessel. Scattered vascular calcifications noted within the carotid siphons. Skull: Scalp soft tissues  and calvarium within normal limits. Sinuses: Few scattered subcentimeter retention cyst noted within the right maxillary and left sphenoid sinuses. Mild mucosal thickening within the ethmoidal air cells on the left. Paranasal sinuses are otherwise clear. Torus palatini partially visualized. Mastoid air cells are clear. Cyst noted. Paranasal sinuses Orbits: Globes and orbital soft tissues within normal limits. Patient status post lens extraction bilaterally. CTA HEAD Anterior circulation: Visualized distal cervical segments of the internal carotid arteries are well opacified and symmetric bilaterally. Petrous segments widely patent bilaterally. Mild scattered atheromatous plaque within the cavernous ICAs without significant stenosis. Mild atheromatous narrowing at the supraclinoid left ICA. Supraclinoid right ICA widely patent. ICA termini widely patent. A1 segments well opacified and symmetric. Patent anterior communicating artery. Anterior cerebral arteries widely patent to their distal aspects without stenosis. M1 segments widely patent without stenosis or occlusion. Normal MCA bifurcations. Distal MCA branches well perfused and fairly symmetric. Mild mass effect on the distal left MCA branches due to the large intraparenchymal hematoma. No vascular out formation or other finding underlying the hematoma identified. Posterior circulation: Left vertebral artery dominant and widely patent to the vertebrobasilar junction. Right vertebral artery hypoplastic with multifocal atheromatous irregularity with mild right V4 stenosis. Left PICA patent. Right PICA not well visualized. Basilar artery mildly irregular but widely patent to its distal aspect without stenosis. Superior cerebral arteries patent proximally. Both of the posterior cerebral arteries largely supplied via the basilar and are well perfused to their  distal aspects without high-grade stenosis. Small bilateral posterior communicating arteries noted. Venous  sinuses: Grossly patent, although not well evaluated due to arterial timing of the contrast bolus. Anatomic variants: None significant. No vascular abnormality seen underlying the left frontal hematoma. Delayed phase: Not performed. IMPRESSION: 1. 7.9 x 3.4 x 5.1 cm acute intraparenchymal hematoma positioned within the anterior left frontal lobe (estimated volume 68 cc). Associated regional mass effect with trace 3 mm left-to-right shift. Primary differential considerations include hypertensive bleed or possibly amyloid angiopathy. 2. Underlying moderate cerebral atrophy with chronic small vessel ischemic disease. 3. Negative intracranial CTA. No vascular abnormality seen underlying the intraparenchymal hematoma. No large vessel occlusion. 4. Mild for age intracranial atherosclerosis as above. No high-grade or correctable stenosis. Critical Value/emergent results were called by telephone at the time of interpretation on 04/25/2017 at 4:40 pm to Dr. Samara Snide , who verbally acknowledged these results. Electronically Signed   By: Jeannine Boga M.D.   On: 04/25/2017 17:25    PHYSICAL EXAM  Temp:  [95.5 F (35.3 C)-100.5 F (38.1 C)] 98.8 F (37.1 C) (02/01 0800) Pulse Rate:  [88-125] 108 (02/01 0900) Resp:  [13-28] 19 (02/01 0900) BP: (102-145)/(46-77) 131/62 (02/01 0900) SpO2:  [88 %-99 %] 97 % (02/01 0900)  General - Well nourished, well developed, not in acute distress.  Ophthalmologic - fundi not visualized due to noncooperation.  Cardiovascular - Regular rate and rhythm with no murmur.  Neuro - awake alert, eyes open, global aphasia, no speech output, not following commands.  Left gaze preference, but able to cross midline, able to track object from left to right, right-sided neglect.  PERRL, blinking to visual threat on the left.  Right facial droop, tongue examination not cooperative.  Left upper extremity 4/5, spontaneous movement.  Bilateral lower extremity withdrawal 2+/5 on  pain stimulation.  Right upper extremity flaccid.  DTR 1+, Babinski negative. Sensation, coordination and gait not tested.   ASSESSMENT/PLAN [name] is a 84 y.o. female with history of hyperlipidemia, hypertension, dementia admitted for aphasia and right-sided weakness. No tPA given due to [place].    ICH:  left frontal large ICH, hypertensive versus cerebral amyloid angiopathy  Resultant RUE plegia, right neglect, global aphasia  CT head large left frontal ICH  Repeat CT head showed mild enlargement of left frontal ICH  CTA head no AVM or aneurysm  SCDs for VTE prophylaxis Diet NPO time specified  Fall precautions   aspirin 81 mg daily prior to admission, now on No antithrombotic  Due to [place]  Ongoing aggressive stroke risk factor management  Therapy recommendations: Pending  Disposition: Pending  Palliative care on board and discussed with daughters will continue current care but no escalation. No PEG or surgery, but ok with cortrak for temporary feeding  Cerebral edema  Repeat CT showed mild enlargement of left frontal ICH  Mild midline shift  Off 3% saline via peripheral  palliative care on board, no more 3%, now on NS @ 50  Dysphagia   Speech on board  NPO now  On IVF  Hypertension Stable BP goal < 160 Off Cardene drip, on labetalol PRN  Long term BP goal normotensive  Hyperlipidemia  Home meds: Crestor  Continue statin once p.o. access  Other Stroke Risk Factors  Advanced age  Other Active Problems  Dementia   Hospital day # 2  This patient is critically ill due to left frontal large ICH, cerebral edema, hypertensive emergency and at significant risk of neurological worsening, death  form stroke, hemorrhagic conversion, brain herniation, heart failure. This patient's care requires constant monitoring of vital signs, hemodynamics, respiratory and cardiac monitoring, review of multiple databases, neurological assessment, discussion  with family, other specialists and medical decision making of high complexity. I had long discussion with daughters at bedside, updated pt current condition, treatment plan and potential prognosis. They expressed understanding and appreciation. I spent 40 minutes of neurocritical care time in the care of this patient.   Rosalin Hawking, MD PhD Stroke Neurology 04/27/2017 11:46 AM    To contact Stroke Continuity provider, please refer to [email or web address] After hours, contact General Neurology

## 2017-04-27 NOTE — Progress Notes (Signed)
Pt IV infiltration at 2215, spoke with daughters about need for additional IV in order to run 3% saline peripherally. While in the process of starting the IV, daughters expressed that patient wouldn't want to continue with current therapy and asked if we could forgo restarting 3% saline. I gave them the opportunity to discuss together and they decided to stop 3% at this point and only continue with blood pressure support with Nicardipine. MD notified of families current wishes. Na lab draws changed to qday. Will continue to monitor patient.

## 2017-04-27 NOTE — Progress Notes (Signed)
OT Cancellation Note  Patient Details Name: Brianna Gregory MRN: [id number] DOB: 08/21/1926   Cancelled Treatment:    Reason Eval/Treat Not Completed: Other (comment). Signing off at this time due to pt apparently going comfort care. If OT eval needed, please reorder.Thanks.  [place], OT/L  [phone] 04/27/2017 04/27/2017, 7:23 AM

## 2017-04-27 NOTE — Progress Notes (Signed)
Modified Barium Swallow Progress Note  Patient Details  Name: Brianna Gregory MRN: [id number] Date of Birth: 05/07/1926  Today's Date: 04/27/2017  Modified Barium Swallow completed.  Full report located under Chart Review in the Imaging Section.  Brief recommendations include the following:  Clinical Impression  Pt presents with a moderate to severe oropharyngeal dysphagia marked by decreased oral cohesion and transfer of POs into pharynx; delayed swallow, triggered at the level of the pyriforms for most consistencies.  There is reduced mobility/strength of the laryngeal and pharyngeal musculature, leading to diffuse reside throughout pharynx before and after swallow.  Nectar and honey-thick liquids were aspirated before, during, and after the swallow onset.  Coughing was elicited but not sufficient to eject aspirate. Material was suctioned from oral cavity and study was discontinued.  Recommend continued NPO.  Family has met with Palliative medicine and they would like to pursue Cortrak to allow time for their mother to maximize any functional recovery. Spoke at length with pt's daughter, Jenny Reichmann, and son, Patrick Jupiter, after the [place].  We reviewed the video and discussed results.  They are realistic about prognosis.  Educated re: aphasia and tips for communication.  SLP will follow while pt remains in acute care.    Swallow Evaluation Recommendations       SLP Diet Recommendations: NPO        may have occasional ice chips after oral care, when alert and upright               Oral Care Recommendations: Oral care QID       Alesha Jaffee L. Tivis Ringer, [place] CCC/SLP Pager [phone]  Juan Quam Laurice 04/27/2017,3:23 PM

## 2017-04-27 NOTE — Progress Notes (Signed)
Initial Nutrition Assessment  INTERVENTION:   Jevity 1.2 @ 50 ml/hr (1200 ml/day) 30 ml Prostat daily  Provides: 1540 kcal, 81 grams protein, and 973 ml free water.   NUTRITION DIAGNOSIS:   Inadequate oral intake related to inability to eat as evidenced by NPO status.  GOAL:   Patient will meet greater than or equal to 90% of their needs  MONITOR:   Diet advancement, TF tolerance  REASON FOR ASSESSMENT:   Consult Enteral/tube feeding initiation and management  ASSESSMENT:   Pt with PMH of HTN, HLD, mild dementia who was admitted with large L ICH with RUE plegia, R neglect, global aphasia.    Pt discussed during ICU rounds and with RN.  Pt failed swallow eval and will have Cortrak tube placed today. Per RN this is short term with no PEG planned.   History obtained from son. Per son pt has had a slow decline in weight as she was caring for her husband in an independent living. He passed Oct 2018 and pt moved in to a ALF. She swims and does yoga. She recently started using a walker.  Per son pt had been eating about 1/2 of her meals at her ALF.   Medications reviewed and include: senokot Labs reviewed CBG's: [phone]  NUTRITION - FOCUSED PHYSICAL EXAM:    Most Recent Value  Orbital Region  Mild depletion  Upper Arm Region  No depletion  Thoracic and Lumbar Region  No depletion  Buccal Region  No depletion  Temple Region  No depletion  Clavicle Bone Region  No depletion  Clavicle and Acromion Bone Region  Mild depletion  Scapular Bone Region  Unable to assess  Dorsal Hand  Mild depletion  Patellar Region  No depletion  Anterior Thigh Region  No depletion  Posterior Calf Region  Mild depletion  Edema (RD Assessment)  None  Hair  Reviewed  Eyes  Reviewed  Mouth  Reviewed  Skin  Reviewed  Nails  Reviewed       Diet Order:  Diet NPO time specified Fall precautions  EDUCATION NEEDS:   No education needs have been identified at this time  Skin:  Skin  Assessment: (MASD: buttocks, anus, vagina)  Last BM:  unknown  Height:   Ht Readings from Last 1 Encounters:  04/25/17 5\' 4"  (1.626 m)    Weight:   Wt Readings from Last 1 Encounters:  04/25/17 138 lb 0.1 oz (62.6 kg)    Ideal Body Weight:  54.5 kg  BMI:  Body mass index is 23.69 kg/m.  Estimated Nutritional Needs:   Kcal:  1500-1700  Protein:  75-85 grams  Fluid:  > 1.5 L/day  Maylon Peppers RD, LDN, CNSC [phone] Pager [phone] After Hours Pager

## 2017-04-27 NOTE — Progress Notes (Signed)
Pt family notified of pt temperature 100.5, given option of controlling with Tylenol. Due to their wishes to transition patient to palliative care, they chose not to give this medication at this time. Pt also due for Na lab draw. Will delay to day shift to give family a chance to talk to MD and decide if further invasive procedures are necessary. Will continue to monitor patient.

## 2017-04-27 NOTE — Consult Note (Signed)
Consultation Note Date: 04/27/2017   Patient Name: Brianna Gregory  DOB: 08/27/1926  MRN: [id number]  Age / Sex: 84 y.o., female  PCP: No primary care provider on file. Referring Physician: Rosalin Hawking, MD  Reason for Consultation: Establishing goals of care and Psychosocial/spiritual support  HPI/Patient Profile: 84 y.o. female  with past medical history of hypertension, hyperlipidemia, early stages dementia admitted on 04/25/2017 with large frontal left ICH, cerebral edema, hypertensive emergency.. Patient resides at assisted living level of care at [facility].  Her husband died in 2017-01-23.  Consult ordered for goals of care  Clinical Assessment and Goals of Care: Met with patient and her family (2 daughters, 1 son); chart reviewed.  Family shares that this is been a long hard year.  Their father, patient's husband of 84 years, died in January 23, 2017.  Brianna Gregory, is a Marine scientist by profession.  She has often shared with her family 1 of her biggest fears would be a stroke.  Patient has 3 children, widow.  Her children are assisting in decision making; they are making decisions as a family. Vernard Gambles [phone] Jolaine Artist [phone]  Family is struggling as patient is a lot more alert today than she was yesterday.  Daughter shared with me that "we thoroughly expected her to die and now she is attempting to communicate with [place]".  They want to continue to do everything they can to to support her but they note that she would not want to live with disability    SUMMARY OF RECOMMENDATIONS   Confirmed DNR/DNI Would not want a permanent feeding tube such as a PEG or G-tube Would consider a core track temporarily as we wait and see how patient is going to do so to speak Would like for speech and language to come back , now that patient is more alert, and make  dietary recommendations if possible She is not full comfort care yet.  Goal is can continue to try to treat the treatable Palliative medicine will re-meet with family on 04/29/2017 at 2 PM Ultimate goal would be to return to [facility].  If patient does require a core track temporarily, a question for social work would  [facility] take her with a temporary feeding tube, with a defined limit, in the setting of no permanent artificial feeding such as a PEG tube Code Status/Advance Care Planning:  DNR    Symptom Management:   Pain: No nonverbal signs and symptoms of pain.  Continue with Tylenol as needed for mild to moderate pain.  If more aggressive management tools are needed, patient would benefit from morphine IV or oral concentrate .  Her creatinine is 0.83  Dyspnea: Continue with targeted pulmonary treatment.  Monitor for need for initiation of opioids for severe dyspnea  Hypertension.  Continue with nicardipine drip  Palliative Prophylaxis:   Aspiration, Bowel Regimen, Delirium Protocol, Eye Care, Frequent Pain Assessment, Oral Care and Turn Reposition  Additional Recommendations (Limitations, Scope, Preferences):  Avoid Hospitalization, No Blood Transfusions,  No Chemotherapy, No Hemodialysis, No Radiation, No Surgical Procedures and No Tracheostomy  Psycho-social/Spiritual:   Desire for further Chaplaincy support:no  Additional Recommendations: Grief/Bereavement Support  Prognosis:   Unable to determine  Discharge Planning: To Be Determined      Primary Diagnoses: Present on Admission: . ICH (intracerebral hemorrhage) ([place])   I have reviewed the medical record, interviewed the patient and family, and examined the patient. The following aspects are pertinent.  Past Medical History:  Diagnosis Date  . Arthritis   . Diverticulosis    colonoscopy 2007  . GERD (gastroesophageal reflux disease)   . Glaucoma   . HLD (hyperlipidemia)   . Hypertension   .  Lumbar herniated disc   . Menopause   . Osteoporosis   . Skin cancer   . Urge incontinence   . UTI (urinary tract infection)   . Vitamin D deficiency    Social History   Socioeconomic History  . Marital status: Married    Spouse name: None  . Number of children: 4  . Years of education: None  . Highest education level: None  Social Needs  . Financial resource strain: None  . Food insecurity - worry: None  . Food insecurity - inability: None  . Transportation needs - medical: None  . Transportation needs - non-medical: None  Occupational History  . Occupation: retired Marine scientist  Tobacco Use  . Smoking status: Never Smoker  . Smokeless tobacco: Never Used  Substance and Sexual Activity  . Alcohol use: Yes    Alcohol/week: 1.2 oz    Types: 2 Glasses of wine per week  . Drug use: No  . Sexual activity: Not Currently  Other Topics Concern  . None  Social History Narrative  . None   Family History  Problem Relation Age of Onset  . Heart failure Mother   . Diabetes Mother   . Heart failure Father   . Diabetes Father   . Breast cancer Sister   . Diabetes Sister    Scheduled Meds: .  stroke: mapping our early stages of recovery book   Does not apply Once  . pantoprazole (PROTONIX) IV  40 mg Intravenous QHS  . senna-docusate  1 tablet Oral BID   Continuous Infusions: . niCARDipine Stopped (04/27/17 0810)   PRN Meds:.acetaminophen **OR** acetaminophen (TYLENOL) oral liquid 160 mg/5 mL **OR** acetaminophen Medications Prior to Admission:  Prior to Admission medications   Medication Sig Start Date End Date Taking? Authorizing Provider  acetaminophen (TYLENOL) 325 MG tablet Take 650 mg by mouth every 4 (four) hours as needed for mild pain.   Yes [provider]  acetaminophen (TYLENOL) 500 MG tablet Take 500 mg by mouth 2 (two) times daily.   Yes [provider]  aspirin EC 81 MG tablet Take 81 mg by mouth.   Yes [provider]  bisacodyl  (DULCOLAX) 5 MG EC tablet Take 5 mg by mouth daily as needed for moderate constipation.   Yes [provider]  CALCIUM-VITAMIN D PO Take 1 tablet by mouth 2 (two) times daily.   Yes [provider]  CELEBREX 200 MG capsule TAKE 1 CAPSULE TWICE DAILY 03/24/13  Yes Schoenhoff, Altamese Cabal, MD  donepezil (ARICEPT) 10 MG tablet Take 10 mg by mouth at bedtime.    Yes [provider]  folic acid (FOLVITE) 1 MG tablet Take 1 mg by mouth daily.    Yes [provider]  omeprazole (PRILOSEC) 20 MG capsule TAKE 1 CAPSULE DAILY  03/10/14  Yes Schoenhoff, Altamese Cabal, MD  rosuvastatin (CRESTOR) 10 MG tablet Take 10 mg by mouth daily.  02/15/15 04/26/17 Yes [provider]  senna-docusate (SENOKOT-S) 8.6-50 MG tablet Take 1 tablet by mouth at bedtime.   Yes [provider]  tamoxifen (NOLVADEX) 20 MG tablet Take 20 mg by mouth daily.   Yes [provider]  timolol (BETIMOL) 0.5 % ophthalmic solution Place 1 drop into both eyes daily.     Yes [provider]  tolterodine (DETROL LA) 2 MG 24 hr capsule Take 2 mg by mouth daily.   Yes [provider]  lisinopril (PRINIVIL,ZESTRIL) 5 MG tablet Take 5 mg by mouth. 02/12/15 02/12/16  [provider]  polyethylene glycol powder (GLYCOLAX/MIRALAX) powder Take as directed on bottle Patient not taking: Reported on 04/26/2017 12/26/12   Schoenhoff, Altamese Cabal, MD  traMADol (ULTRAM) 50 MG tablet Take 1 tablet (50 mg total) by mouth every 8 (eight) hours as needed. Patient not taking: Reported on 04/26/2017 05/17/12   Schoenhoff, Altamese Cabal, MD   Allergies  Allergen Reactions  . Statins Other (See Comments)    myalgias   Review of Systems  Unable to perform ROS: Patient nonverbal    Physical Exam  Constitutional: She appears well-developed and well-nourished.  Ill-appearing elderly female; response to voice, makes eye contact, nods her head yes or no  HENT:  Head: Normocephalic and  atraumatic.  Cardiovascular: Normal rate.  Pulmonary/Chest: Effort normal.  Neurological:  Patient is more alert today, per family.  She does make eye contact with me when I introduced myself.  She nods her head yes or no and at this point , seems to be appropriate answers  Skin: Skin is warm and dry. There is pallor.  Psychiatric:  No overt agitation otherwise unable to test    Vital Signs: BP 131/62   Pulse (!) 108   Temp 98.8 F (37.1 C) (Oral)   Resp 19   Ht 5' 4"  (1.626 m)   Wt 62.6 kg (138 lb 0.1 oz)   SpO2 97%   BMI 23.69 kg/m  Pain Assessment: PAINAD       SpO2: SpO2: 97 % O2 Device:SpO2: 97 % O2 Flow Rate: .   IO: Intake/output summary:   Intake/Output Summary (Last 24 hours) at 04/27/2017 1036 Last data filed at 04/27/2017 3662 Gross per 24 hour  Intake 2124.59 ml  Output 1050 ml  Net 1074.59 ml    LBM: Last BM Date: (PTA) Baseline Weight: Weight: 62.6 kg (138 lb 0.1 oz) Most recent weight: Weight: 62.6 kg (138 lb 0.1 oz)     Palliative Assessment/Data:   Flowsheet Rows     Most Recent Value  Intake Tab  Referral Department  Neurology  Unit at Time of Referral  ICU  Palliative Care Primary Diagnosis  Neurology  Date Notified  04/26/17  Palliative Care Type  New Palliative care  Reason for referral  Clarify Goals of Care  Date of Admission  04/25/17  # of days IP prior to Palliative referral  1  Clinical Assessment  Palliative Performance Scale Score  30%  Pain Max last 24 hours  Not able to report  Pain Min Last 24 hours  Not able to report  Dyspnea Max Last 24 Hours  Not able to report  Dyspnea Min Last 24 hours  Not able to report  Nausea Max Last 24 Hours  Not able to report  Nausea Min Last 24 Hours  Not able  to report  Anxiety Max Last 24 Hours  Not able to report  Anxiety Min Last 24 Hours  Not able to report  Other Max Last 24 Hours  Not able to report  Psychosocial & Spiritual Assessment  Palliative Care Outcomes  Patient/Family  meeting held?  Yes  Who was at the meeting?  2 dtr's, 1 son  Patient/Family wishes: Interventions discontinued/not started   Mechanical Ventilation, PEG, Trach, Hemodialysis  Palliative Care follow-up planned  Yes, Facility      Time In: 0900 Time Out: 1010 Time Total: 70 min Greater than 50%  of this time was spent counseling and coordinating care related to the above assessment and plan.  Signed by: Dory Horn, NP   Please contact Palliative Medicine Team phone at [phone] for questions and concerns.  For individual provider: See Shea Evans

## 2017-04-28 ENCOUNTER — Inpatient Hospital Stay (HOSPITAL_COMMUNITY): Payer: Medicare Other

## 2017-04-28 LAB — URINALYSIS, COMPLETE (UACMP) WITH MICROSCOPIC
Bilirubin Urine: NEGATIVE
Glucose, UA: 50 mg/dL — AB
Ketones, ur: NEGATIVE mg/dL
NITRITE: NEGATIVE
PROTEIN: 30 mg/dL — AB
Specific Gravity, Urine: 1.016 (ref 1.005–1.030)
pH: 6 (ref 5.0–8.0)

## 2017-04-28 LAB — MAGNESIUM
MAGNESIUM: 2.7 mg/dL — AB (ref 1.7–2.4)
MAGNESIUM: 2.7 mg/dL — AB (ref 1.7–2.4)

## 2017-04-28 LAB — GLUCOSE, CAPILLARY
GLUCOSE-CAPILLARY: 148 mg/dL — AB (ref 65–99)
GLUCOSE-CAPILLARY: 164 mg/dL — AB (ref 65–99)
Glucose-Capillary: 155 mg/dL — ABNORMAL HIGH (ref 65–99)
Glucose-Capillary: 168 mg/dL — ABNORMAL HIGH (ref 65–99)
Glucose-Capillary: 170 mg/dL — ABNORMAL HIGH (ref 65–99)
Glucose-Capillary: 176 mg/dL — ABNORMAL HIGH (ref 65–99)

## 2017-04-28 LAB — CBC
HCT: 39.6 % (ref 36.0–46.0)
Hemoglobin: 12.7 g/dL (ref 12.0–15.0)
MCH: 30.8 pg (ref 26.0–34.0)
MCHC: 32.1 g/dL (ref 30.0–36.0)
MCV: 96.1 fL (ref 78.0–100.0)
PLATELETS: 137 10*3/uL — AB (ref 150–400)
RBC: 4.12 MIL/uL (ref 3.87–5.11)
RDW: 16 % — AB (ref 11.5–15.5)
WBC: 13.1 10*3/uL — ABNORMAL HIGH (ref 4.0–10.5)

## 2017-04-28 LAB — BASIC METABOLIC PANEL
ANION GAP: 9 (ref 5–15)
BUN: 34 mg/dL — ABNORMAL HIGH (ref 6–20)
CALCIUM: 8.5 mg/dL — AB (ref 8.9–10.3)
CO2: 25 mmol/L (ref 22–32)
Chloride: 119 mmol/L — ABNORMAL HIGH (ref 101–111)
Creatinine, Ser: 0.6 mg/dL (ref 0.44–1.00)
GFR calc Af Amer: >60 mL/min (ref >60)
GLUCOSE: 181 mg/dL — AB (ref 65–99)
Potassium: 2.8 mmol/L — ABNORMAL LOW (ref 3.5–5.1)
SODIUM: 153 mmol/L — AB (ref 135–145)

## 2017-04-28 LAB — PHOSPHORUS
Phosphorus: 1.5 mg/dL — ABNORMAL LOW (ref 2.5–4.6)
Phosphorus: 1.7 mg/dL — ABNORMAL LOW (ref 2.5–4.6)

## 2017-04-28 MED ORDER — DEXTROSE 5 % IV SOLN
1.0000 g | Freq: Every day | INTRAVENOUS | Status: DC
  Administered 2017-04-29 (×2): 1 g via INTRAVENOUS
  Filled 2017-04-28 (×2): qty 10

## 2017-04-28 MED ORDER — WHITE PETROLATUM EX OINT
TOPICAL_OINTMENT | CUTANEOUS | Status: AC
  Administered 2017-04-28: 17:00:00
  Filled 2017-04-28: qty 28.35

## 2017-04-28 MED ORDER — FREE WATER
100.0000 mL | Freq: Three times a day (TID) | Status: DC
  Administered 2017-04-28 – 2017-04-30 (×6): 100 mL

## 2017-04-28 MED ORDER — POTASSIUM CHLORIDE 20 MEQ/15ML (10%) PO SOLN
20.0000 meq | Freq: Two times a day (BID) | ORAL | Status: AC
  Administered 2017-04-28 – 2017-04-30 (×4): 20 meq via ORAL
  Filled 2017-04-28 (×4): qty 15

## 2017-04-28 MED ORDER — MAGNESIUM SULFATE 2 GM/50ML IV SOLN
2.0000 g | Freq: Once | INTRAVENOUS | Status: DC
  Filled 2017-04-28 (×2): qty 50

## 2017-04-28 MED ORDER — POTASSIUM CHLORIDE 20 MEQ/15ML (10%) PO SOLN: 40.0000 meq | Freq: Two times a day (BID) | ORAL | Status: DC

## 2017-04-28 MED ORDER — POTASSIUM CHLORIDE 20 MEQ/15ML (10%) PO SOLN
40.0000 meq | Freq: Once | ORAL | Status: AC
  Administered 2017-04-29: 40 meq via ORAL
  Filled 2017-04-28: qty 30

## 2017-04-28 MED ORDER — SODIUM GLYCEROPHOSPHATE 1 MMOLE/ML IV SOLN
20.0000 mmol | Freq: Once | INTRAVENOUS | Status: AC
  Administered 2017-04-28: 20 mmol via INTRAVENOUS
  Filled 2017-04-28: qty 20

## 2017-04-28 NOTE — Progress Notes (Addendum)
Family called with questions upon DC. Plan is to go back to [facility] with Cortrak in place temporarily.Per manufacturer of Cortrak, those tubes can stay in up to 30 days; beyond that is at provider discretion. They would like to have Palliative Care to follow at the facility. Those numbers are: [facility] through [place] [phone] or Palliative Care division through [facility] at [phone]. Family is not going to proceed with permanent feeding tube. If pt unable to progress on her own, family plans to pursue hospice or other care through the facility for EOL. They are canceling our FU meeting for 04/29/17 2 2pm.  Daughter is also asking if a repeat CT will be done prior to DC? Thank you, Romona Curls, ANP  In 1700 Out 1716 Total 15 min

## 2017-04-28 NOTE — Progress Notes (Signed)
STROKE TEAM PROGRESS NOTE   SUBJECTIVE (INTERVAL HISTORY) Her son is at the bedside.  Pt awake alert today and able to repeat simple sentences, track objects to the right. Still has right neglect and hemianopia, right hemiplegia. Had cortrak placed yesterday and on TF. Plan to d/c back to [facility] next week.    OBJECTIVE Temp:  [98.1 F (36.7 C)-99.2 F (37.3 C)] 98.8 F (37.1 C) (02/02 1340) Pulse Rate:  [90-116] 95 (02/02 1340) Cardiac Rhythm: Normal sinus rhythm (02/02 0700) Resp:  [17-25] 19 (02/02 1340) BP: (109-169)/(50-87) 169/87 (02/02 1340) SpO2:  [93 %-100 %] 98 % (02/02 1340) Weight:  [137 lb 12.6 oz (62.5 kg)] 137 lb 12.6 oz (62.5 kg) (02/01 2150)  Recent Labs  Lab 04/27/17 2006 04/28/17 0010 04/28/17 0434 04/28/17 0758 04/28/17 1117  GLUCAP 184* 168* 148* 176* 155*   Recent Labs  Lab 04/25/17 1625 04/25/17 1626 04/26/17 0419 04/26/17 0957 04/26/17 1428 04/26/17 1941 04/27/17 1622 04/28/17 0612  NA 144 141 140 145 146* 149*  --  153*  K 3.9 4.0  --   --   --   --   --  2.8*  CL 106 107  --   --   --   --   --  119*  CO2  --  24  --   --   --   --   --  25  GLUCOSE 150* 153*  --   --   --   --   --  181*  BUN 29* 29*  --   --   --   --   --  34*  CREATININE 0.80 0.83  --   --   --   --   --  0.60  CALCIUM  --  8.8*  --   --   --   --   --  8.5*  MG  --   --   --   --   --   --  2.6* 2.7*  PHOS  --   --   --   --   --   --  1.5* 1.5*   Recent Labs  Lab 04/25/17 1626  AST 27  ALT 12*  ALKPHOS 38  BILITOT 0.4  PROT 6.0*  ALBUMIN 3.5   Recent Labs  Lab 04/25/17 1625 04/25/17 1626 04/28/17 0612  WBC  --  8.5 13.1*  NEUTROABS  --  5.6  --   HGB 13.6 12.9 12.7  HCT 40.0 40.5 39.6  MCV  --  94.6 96.1  PLT  --  168 137*   No results for input(s): CKTOTAL, CKMB, CKMBINDEX, TROPONINI in the last 168 hours. Recent Labs    04/25/17 1626  LABPROT 13.3  INR 1.02   No results for input(s): COLORURINE, LABSPEC, PHURINE, GLUCOSEU, HGBUR,  BILIRUBINUR, KETONESUR, PROTEINUR, UROBILINOGEN, NITRITE, LEUKOCYTESUR in the last 72 hours.  Invalid input(s): APPERANCEUR     Component Value Date/Time   CHOL 228 (H) 01/29/2012 1111   TRIG 122 01/29/2012 1111   HDL 52 01/29/2012 1111   CHOLHDL 4.4 01/29/2012 1111   VLDL 24 01/29/2012 1111   LDLCALC 152 (H) 01/29/2012 1111   No results found for: HGBA1C No results found for: LABOPIA, COCAINSCRNUR, LABBENZ, AMPHETMU, THCU, LABBARB  No results for input(s): ETH in the last 168 hours.   IMAGING  I have personally reviewed the radiological images below and agree with the radiology interpretations.  Ct Angio Head W Or Wo Contrast Ct  Head Code Stroke Wo Contrast 04/25/2017  IMPRESSION:  1. 7.9 x 3.4 x 5.1 cm acute intraparenchymal hematoma positioned within the anterior left frontal lobe (estimated volume 68 cc). Associated regional mass effect with trace 3 mm left-to-right shift. Primary differential considerations include hypertensive bleed or possibly amyloid angiopathy.  2. Underlying moderate cerebral atrophy with chronic small vessel ischemic disease.  3. Negative intracranial CTA. No vascular abnormality seen underlying the intraparenchymal hematoma. No large vessel occlusion.  4. Mild for age intracranial atherosclerosis as above. No high-grade or correctable stenosis.   Ct Head Wo Contrast 04/26/2017 IMPRESSION:  1. Increased size of left frontal hematoma 99 cc, previously 68 cc.  2. Increased small volume subarachnoid hemorrhage over left frontal convexity.  3. Increase edema and mass effect with left-to-right midline shift of 7 mm. Mild left-to-right subfalcine herniation. No downward herniation.    Dg Swallowing Func-speech Pathology 04/27/2017 Recommend continued NPO.  Family has met with Palliative medicine and they would like to pursue Cortrak to allow time for their mother to maximize any functional recovery.            PHYSICAL EXAM  Temp:  [98.1 F (36.7  C)-99.2 F (37.3 C)] 98.8 F (37.1 C) (02/02 1340) Pulse Rate:  [90-116] 95 (02/02 1340) Resp:  [17-25] 19 (02/02 1340) BP: (109-169)/(50-87) 169/87 (02/02 1340) SpO2:  [93 %-100 %] 98 % (02/02 1340) Weight:  [137 lb 12.6 oz (62.5 kg)] 137 lb 12.6 oz (62.5 kg) (02/01 2150)  General - Well nourished, well developed, not in acute distress.  Ophthalmologic - fundi not visualized due to noncooperation.  Cardiovascular - Regular rate and rhythm with no murmur.  Neuro - awake alert, eyes open, able to follow limited commands, little spontaneous speech output, not able to name but able to repeat simple sentences.  Left gaze preference, but able to cross midline, able to track object from left to right, right-sided neglect.  PERRL, blinking to visual threat on the left.  Right facial droop, tongue examination not cooperative.  Left upper extremity 4/5, spontaneous movement.  Bilateral lower extremity withdrawal 2+/5 on pain stimulation.  Right upper extremity flaccid.  DTR 1+, Babinski negative. Sensation, coordination and gait not tested.   ASSESSMENT/PLAN Brianna Gregory is a 84 y.o. female with history of hyperlipidemia, hypertension, dementia admitted for aphasia and right-sided weakness. No tPA given due to ICH.    ICH:  left frontal large ICH, hypertensive versus cerebral amyloid angiopathy  Resultant RUE plegia, right neglect, global aphasia  CT head large left frontal ICH  Repeat CT head showed mild enlargement of left frontal ICH  CTA head no AVM or aneurysm  Repeat CT in am  SCDs for VTE prophylaxis Diet NPO time specified  Fall precautions   aspirin 81 mg daily prior to admission, now on No antithrombotic  due to ICH  Ongoing aggressive stroke risk factor management  Therapy recommendations: PT and OT recommend SNF  Disposition: Pending  Palliative care on board and discussed with daughters will continue current care but no escalation. No PEG or  surgery  Appreciate Palliative Care's assistance  Cerebral edema  Repeat CT showed mild enlargement of left frontal ICH  Mild midline shift  Off 3% saline via peripheral  Na 153  CT repeat pending in am  Dysphagia   Speech on board  NPO now  On TF with cortrak  UTI  WBC TNTC  Put on rocephin  Hypertension Stable BP goal < 160 Off Cardene drip, on   labetalol PRN  Long term BP goal normotensive  Hyperlipidemia  Home meds: Crestor  Resume crestor   Other Stroke Risk Factors  Advanced age  Other Active Problems  Dementia   Leukocytosis - 13.1 - temp 100 - Check CXR unremarkable but U/A showed UTI  Hypokalemia - 2.8 -> supplement  hypomagnesim - 2.7 (H)  Phos - 1.5 -> supplement  Recheck labs in AM  Hospital day # 3  Rosalin Hawking, MD PhD Stroke Neurology 04/29/2017 12:09 AM       To contact Stroke Continuity provider, please refer to [email or web address] After hours, contact General Neurology

## 2017-04-28 NOTE — Progress Notes (Signed)
Pt transferred from 4 N ICU with right hemiplegia and rt neglect. Pt alert, follows  simple command pupils reactive and equal and track midway to the right. Cortrat to rt nares in place intact and patent purposeful  Left side movements but only slightly withdraws to pain with R LE. No movement of the RUE /flaccid. . No acute distress.Pt's family member at bedside cardiac monitor  applied and verification   completed. RN will continue to monitor pt.

## 2017-04-29 ENCOUNTER — Inpatient Hospital Stay (HOSPITAL_COMMUNITY): Payer: Medicare Other

## 2017-04-29 DIAGNOSIS — E876 Hypokalemia: Secondary | ICD-10-CM

## 2017-04-29 DIAGNOSIS — D72829 Elevated white blood cell count, unspecified: Secondary | ICD-10-CM

## 2017-04-29 LAB — CBC
HEMATOCRIT: 44.4 % (ref 36.0–46.0)
Hemoglobin: 14.2 g/dL (ref 12.0–15.0)
MCH: 30.9 pg (ref 26.0–34.0)
MCHC: 32 g/dL (ref 30.0–36.0)
MCV: 96.7 fL (ref 78.0–100.0)
Platelets: 127 10*3/uL — ABNORMAL LOW (ref 150–400)
RBC: 4.59 MIL/uL (ref 3.87–5.11)
RDW: 15.9 % — AB (ref 11.5–15.5)
WBC: 13.5 10*3/uL — ABNORMAL HIGH (ref 4.0–10.5)

## 2017-04-29 LAB — BASIC METABOLIC PANEL
ANION GAP: 13 (ref 5–15)
BUN: 31 mg/dL — ABNORMAL HIGH (ref 6–20)
CALCIUM: 8.4 mg/dL — AB (ref 8.9–10.3)
CHLORIDE: 117 mmol/L — AB (ref 101–111)
CO2: 24 mmol/L (ref 22–32)
Creatinine, Ser: 0.59 mg/dL (ref 0.44–1.00)
GFR calc Af Amer: >60 mL/min (ref >60)
GFR calc non Af Amer: >60 mL/min (ref >60)
GLUCOSE: 184 mg/dL — AB (ref 65–99)
POTASSIUM: 3.3 mmol/L — AB (ref 3.5–5.1)
Sodium: 154 mmol/L — ABNORMAL HIGH (ref 135–145)

## 2017-04-29 LAB — GLUCOSE, CAPILLARY
GLUCOSE-CAPILLARY: 187 mg/dL — AB (ref 65–99)
GLUCOSE-CAPILLARY: 157 mg/dL — AB (ref 65–99)
Glucose-Capillary: 157 mg/dL — ABNORMAL HIGH (ref 65–99)

## 2017-04-29 LAB — MAGNESIUM: Magnesium: 2.7 mg/dL — ABNORMAL HIGH (ref 1.7–2.4)

## 2017-04-29 LAB — PHOSPHORUS: Phosphorus: 1.4 mg/dL — ABNORMAL LOW (ref 2.5–4.6)

## 2017-04-29 MED ORDER — HEPARIN SODIUM (PORCINE) 5000 UNIT/ML IJ SOLN
5000.0000 [IU] | Freq: Two times a day (BID) | INTRAMUSCULAR | Status: DC
  Administered 2017-04-29 – 2017-04-30 (×3): 5000 [IU] via SUBCUTANEOUS
  Filled 2017-04-29 (×3): qty 1

## 2017-04-29 MED ORDER — ROSUVASTATIN CALCIUM 20 MG PO TABS
10.0000 mg | ORAL_TABLET | Freq: Every day | ORAL | Status: DC
  Administered 2017-04-29 – 2017-04-30 (×2): 10 mg via ORAL
  Filled 2017-04-29 (×2): qty 1

## 2017-04-29 MED ORDER — SODIUM GLYCEROPHOSPHATE 1 MMOLE/ML IV SOLN
20.0000 mmol | Freq: Once | INTRAVENOUS | Status: DC
  Administered 2017-04-29: 20 mmol via INTRAVENOUS
  Filled 2017-04-29: qty 20

## 2017-04-29 MED ORDER — PANTOPRAZOLE SODIUM 40 MG PO PACK
40.0000 mg | PACK | Freq: Every day | ORAL | Status: DC
  Administered 2017-04-29 – 2017-04-30 (×2): 40 mg
  Filled 2017-04-29 (×2): qty 20

## 2017-04-29 NOTE — Progress Notes (Signed)
STROKE TEAM PROGRESS NOTE   SUBJECTIVE (INTERVAL HISTORY) Her two daughters at the bedside.  Pt awake alert today and able to repeat simple sentences, track objects to the right. Still has right neglect and hemianopia, right hemiplegia. on TF. Plan to d/c back to [facility] tomorrow.    OBJECTIVE Temp:  [97.7 F (36.5 C)-100 F (37.8 C)] 98.8 F (37.1 C) (02/03 1013) Pulse Rate:  [87-99] 99 (02/03 1013) Cardiac Rhythm: Normal sinus rhythm (02/03 0700) Resp:  [18-19] 18 (02/03 1013) BP: (150-177)/(83-102) 177/84 (02/03 1013) SpO2:  [92 %-99 %] 92 % (02/03 1013) Weight:  [139 lb 15.9 oz (63.5 kg)] 139 lb 15.9 oz (63.5 kg) (02/03 0556)  Recent Labs  Lab 04/28/17 1629 04/28/17 2002 04/29/17 0004 04/29/17 0330 04/29/17 1147  GLUCAP 170* 164* 157* 187* 157*   Recent Labs  Lab 04/25/17 1625 04/25/17 1626  04/26/17 0957 04/26/17 1428 04/26/17 1941 04/27/17 1622 04/28/17 0612 04/28/17 1745 04/29/17 0934  NA 144 141   < > 145 146* 149*  --  153*  --  154*  K 3.9 4.0  --   --   --   --   --  2.8*  --  3.3*  CL 106 107  --   --   --   --   --  119*  --  117*  CO2  --  24  --   --   --   --   --  25  --  24  GLUCOSE 150* 153*  --   --   --   --   --  181*  --  184*  BUN 29* 29*  --   --   --   --   --  34*  --  31*  CREATININE 0.80 0.83  --   --   --   --   --  0.60  --  0.59  CALCIUM  --  8.8*  --   --   --   --   --  8.5*  --  8.4*  MG  --   --   --   --   --   --  2.6* 2.7* 2.7* 2.7*  PHOS  --   --   --   --   --   --  1.5* 1.5* 1.7* 1.4*   < > = values in this interval not displayed.   Recent Labs  Lab 04/25/17 1626  AST 27  ALT 12*  ALKPHOS 38  BILITOT 0.4  PROT 6.0*  ALBUMIN 3.5   Recent Labs  Lab 04/25/17 1625 04/25/17 1626 04/28/17 0612 04/29/17 0934  WBC  --  8.5 13.1* 13.5*  NEUTROABS  --  5.6  --   --   HGB 13.6 12.9 12.7 14.2  HCT 40.0 40.5 39.6 44.4  MCV  --  94.6 96.1 96.7  PLT  --  168 137* 127*   No results for input(s): CKTOTAL, CKMB,  CKMBINDEX, TROPONINI in the last 168 hours. No results for input(s): LABPROT, INR in the last 72 hours. Recent Labs    04/28/17 1812  COLORURINE YELLOW  LABSPEC 1.016  PHURINE 6.0  GLUCOSEU 50*  HGBUR SMALL*  BILIRUBINUR NEGATIVE  KETONESUR NEGATIVE  PROTEINUR 30*  NITRITE NEGATIVE  LEUKOCYTESUR LARGE*       Component Value Date/Time   CHOL 228 (H) 01/29/2012 1111   TRIG 122 01/29/2012 1111   HDL 52 01/29/2012 1111   CHOLHDL 4.4 01/29/2012 1111  VLDL 24 01/29/2012 1111   LDLCALC 152 (H) 01/29/2012 1111   No results found for: HGBA1C No results found for: LABOPIA, COCAINSCRNUR, LABBENZ, AMPHETMU, THCU, LABBARB  No results for input(s): ETH in the last 168 hours.   IMAGING  I have personally reviewed the radiological images below and agree with the radiology interpretations.  Ct Head Wo Contrast 04/29/2017 IMPRESSION: 1. Evolving large LEFT frontal intraparenchymal hematoma without rebleed. 2. Similar mass effect with 7 mm LEFT-to-RIGHT midline shift. No ventricular entrapment. 3. Similar to small volume LEFT frontal subarachnoid hemorrhage.  Ct Angio Head W Or Wo Contrast Ct Head Code Stroke Wo Contrast 04/25/2017  IMPRESSION:  1. 7.9 x 3.4 x 5.1 cm acute intraparenchymal hematoma positioned within the anterior left frontal lobe (estimated volume 68 cc). Associated regional mass effect with trace 3 mm left-to-right shift. Primary differential considerations include hypertensive bleed or possibly amyloid angiopathy.  2. Underlying moderate cerebral atrophy with chronic small vessel ischemic disease.  3. Negative intracranial CTA. No vascular abnormality seen underlying the intraparenchymal hematoma. No large vessel occlusion.  4. Mild for age intracranial atherosclerosis as above. No high-grade or correctable stenosis.   Ct Head Wo Contrast 04/26/2017 IMPRESSION:  1. Increased size of left frontal hematoma 99 cc, previously 68 cc.  2. Increased small volume  subarachnoid hemorrhage over left frontal convexity.  3. Increase edema and mass effect with left-to-right midline shift of 7 mm. Mild left-to-right subfalcine herniation. No downward herniation.   Dg Swallowing Func-speech Pathology 04/27/2017 Recommend continued NPO.  Family has met with Palliative medicine and they would like to pursue Cortrak to allow time for their mother to maximize any functional recovery.       PHYSICAL EXAM  Temp:  [97.7 F (36.5 C)-100 F (37.8 C)] 98.8 F (37.1 C) (02/03 1013) Pulse Rate:  [87-99] 99 (02/03 1013) Resp:  [18-19] 18 (02/03 1013) BP: (150-177)/(83-102) 177/84 (02/03 1013) SpO2:  [92 %-99 %] 92 % (02/03 1013) Weight:  [139 lb 15.9 oz (63.5 kg)] 139 lb 15.9 oz (63.5 kg) (02/03 0556)  General - Well nourished, well developed, not in acute distress.  Ophthalmologic - fundi not visualized due to noncooperation.  Cardiovascular - Regular rate and rhythm with no murmur.  Neuro - awake alert, eyes open, able to follow limited commands, little spontaneous speech output, not able to name but able to repeat simple sentences.  Left gaze preference, but able to cross midline, able to track object from left to right, right-sided neglect.  PERRL, blinking to visual threat on the left.  Right facial droop, tongue examination not cooperative.  Left upper extremity 4/5, spontaneous movement.  Bilateral lower extremity withdrawal 2+/5 on pain stimulation.  Right upper extremity flaccid.  DTR 1+, Babinski negative. Sensation, coordination and gait not tested.   ASSESSMENT/PLAN [name] is a 84 y.o. female with history of hyperlipidemia, hypertension, dementia admitted for aphasia and right-sided weakness. No tPA given due to [place].    ICH:  left frontal large ICH, hypertensive versus cerebral amyloid angiopathy  Resultant RUE plegia, right neglect, global aphasia  CT head large left frontal ICH  Repeat CT head showed mild enlargement of left  frontal ICH  CTA head no AVM or aneurysm  Repeat CT 04/29/17 - no significant change  SCDs for VTE prophylaxis Diet NPO time specified  Fall precautions   aspirin 81 mg daily prior to admission, now on No antithrombotic  due to [place]  Ongoing aggressive stroke risk factor management  Therapy  recommendations: PT and OT recommend SNF  Disposition: back to [facility] Monday   Cerebral edema  Repeat CT showed mild enlargement of left frontal ICH  Mild midline shift  Off 3% saline via peripheral  Na 153->154  CT repeat 04/29/17 no significant change  Dysphagia   Speech on board  NPO now  On TF with cortrak  UTI  WBC TNTC  Put on rocephin, started 04/29/2017  Hypertension Stable BP goal < 160 Off Cardene drip, on labetalol PRN  Long term BP goal normotensive  Hyperlipidemia  Home meds: Crestor  Resumed crestor   Other Stroke Risk Factors  Advanced age  Other Active Problems  Dementia at baseline  Leukocytosis - 13.1->13.5 - Tmax 100  Hypokalemia - 2.8 -> supplement -> 3.3 - continue supplement   Hospital day # 4  Rosalin Hawking, MD PhD Stroke Neurology 04/29/2017 4:08 PM  To contact Stroke Continuity provider, please refer to [email or web address] After hours, contact General Neurology

## 2017-04-30 LAB — CBC
HCT: 46.8 % — ABNORMAL HIGH (ref 36.0–46.0)
Hemoglobin: 14.7 g/dL (ref 12.0–15.0)
MCH: 30.4 pg (ref 26.0–34.0)
MCHC: 31.4 g/dL (ref 30.0–36.0)
MCV: 96.9 fL (ref 78.0–100.0)
PLATELETS: 124 10*3/uL — AB (ref 150–400)
RBC: 4.83 MIL/uL (ref 3.87–5.11)
RDW: 16 % — AB (ref 11.5–15.5)
WBC: 11.8 10*3/uL — ABNORMAL HIGH (ref 4.0–10.5)

## 2017-04-30 LAB — BASIC METABOLIC PANEL
Anion gap: 11 (ref 5–15)
BUN: 40 mg/dL — AB (ref 6–20)
CALCIUM: 8.5 mg/dL — AB (ref 8.9–10.3)
CO2: 27 mmol/L (ref 22–32)
CREATININE: 0.61 mg/dL (ref 0.44–1.00)
Chloride: 119 mmol/L — ABNORMAL HIGH (ref 101–111)
GFR calc Af Amer: >60 mL/min (ref >60)
GLUCOSE: 157 mg/dL — AB (ref 65–99)
Potassium: 3.6 mmol/L (ref 3.5–5.1)
Sodium: 157 mmol/L — ABNORMAL HIGH (ref 135–145)

## 2017-04-30 MED ORDER — FREE WATER: 100.0000 mL | Freq: Three times a day (TID) | 2 refills | Status: AC

## 2017-04-30 MED ORDER — PRO-STAT SUGAR FREE PO LIQD: 30.0000 mL | Freq: Every day | ORAL | 0 refills | Status: AC

## 2017-04-30 MED ORDER — CEPHALEXIN 500 MG PO CAPS: 500.0000 mg | ORAL_CAPSULE | Freq: Two times a day (BID) | ORAL | 0 refills | Status: AC

## 2017-04-30 MED ORDER — JEVITY 1.2 CAL PO LIQD: 1000.0000 mL | ORAL | 2 refills | Status: AC

## 2017-04-30 NOTE — Care Management Important Message (Signed)
Important Message  Patient Details  Name: Brianna Gregory MRN: [id number] Date of Birth: July 09, 1926   Medicare Important Message Given:  Yes    Barb Merino [place] 04/30/2017, 3:33 PM

## 2017-04-30 NOTE — Progress Notes (Signed)
Attempted to call report to [phone] and unable to get anyone to answer. Patient family has been updated regarding the transfer.

## 2017-04-30 NOTE — Care Management Note (Signed)
Case Management Note  Patient Details  Name: Brianna Gregory MRN: [id number] Date of Birth: 08-09-1926  Subjective/Objective:                    Action/Plan: Pt discharging to [facility]. No further needs per CM.  Expected Discharge Date:  04/30/17               Expected Discharge Plan:  Skilled Nursing Facility  In-House Referral:  Clinical Social Work  Discharge planning Services  CM Consult  Post Acute Care Choice:    Choice offered to:     DME Arranged:    DME Agency:     HH Arranged:    [place] Agency:     Status of Service:  Completed, signed off  If discussed at [employer], dates discussed:    Additional Comments:  Pollie Friar, RN 04/30/2017, 10:48 AM

## 2017-04-30 NOTE — Progress Notes (Signed)
  Speech Language Pathology Treatment: Cognitive-Linquistic  Patient Details Name: Brianna Gregory MRN: [id number] DOB: 08-24-1926 Today's Date: 04/30/2017 Time: 9480-1655 SLP Time Calculation (min) (ACUTE ONLY): 15 min  Assessment / Plan / Recommendation Clinical Impression  Pt seen to facilitate functional communication. Son reported his mother was repeated words and phrases very well. SLP facilitated attention to right visual field by using family photographs with visual and verbal cues. Pt able to sustain gaze of 15 seconds. Named 3 family members with moderate phonemic cues. Provided moderate verbal cues for increased volume at word level over 5 trials with success. Discussed techniques with pts son.   HPI HPI: Brianna Richardsonis an 84 y.o.femalewith PMH of HTN, HLD, mild dementia who presented as a stroke alert with aphasia and right hemiplegia. CT head showed a large left frontal hemorrhage.      SLP Plan  Continue with current plan of care       Recommendations                   Plan: Continue with current plan of care       GO               [facility], MA CCC-SLP [phone]  Brianna Gregory 04/30/2017, 2:02 PM

## 2017-04-30 NOTE — Progress Notes (Addendum)
Discharge to: [facility] Anticipated discharge date: 04/30/17 Family notified: Yes, at bedside: [place] by: PTAR  Report #: [phone]; Transport set up for after 1:00 PM  CSW signing off.  Laveda Abbe LCSW [phone]

## 2017-04-30 NOTE — Discharge Summary (Signed)
Stroke Discharge Summary  Patient ID: Brianna Gregory   MRN: [id number]      DOB: Dec 05, 1926  Date of Admission: 04/25/2017 Date of Discharge: 04/30/2017  Attending Physician:  Rosalin Hawking, MD, Stroke MD Consultant(s):    Palliative  Patient's PCP:  No primary care provider on file.  DISCHARGE DIAGNOSIS:  Active Problems:   Acute intracranial hemorrhage (HCC)-hypertensive versus amyloid angiopathy Cytotoxic cerebral edema   Goals of care, counseling/discussion   Palliative care by specialist   Hemi-neglect of right side   Intracerebral hemorrhage   Hypokalemia   Hypophosphatemia   Leukocytosis  Past Medical History:  Diagnosis Date  . Arthritis   . Diverticulosis    colonoscopy 2007  . GERD (gastroesophageal reflux disease)   . Glaucoma   . HLD (hyperlipidemia)   . Hypertension   . Lumbar herniated disc   . Menopause   . Osteoporosis   . Skin cancer   . Urge incontinence   . UTI (urinary tract infection)   . Vitamin D deficiency    Past Surgical History:  Procedure Laterality Date  . ABDOMINAL HYSTERECTOMY  1985  . APPENDECTOMY  1947  . CATARACT EXTRACTION     bilateral  . FIXATION KYPHOPLASTY LUMBAR SPINE  2005  . INGUINAL HERNIA REPAIR  1947   right  . INGUINAL HERNIA REPAIR  1971   left   Allergies as of 04/30/2017      Reactions   Statins Other (See Comments)   myalgias      Medication List    STOP taking these medications   aspirin EC 81 MG tablet   CELEBREX 200 MG capsule Generic drug:  celecoxib   polyethylene glycol powder powder Commonly known as:  GLYCOLAX/MIRALAX   traMADol 50 MG tablet Commonly known as:  ULTRAM     TAKE these medications   acetaminophen 500 MG tablet Commonly known as:  TYLENOL Take 500 mg by mouth 2 (two) times daily. What changed:  Another medication with the same name was removed. Continue taking this medication, and follow the directions you see here.   bisacodyl 5 MG EC tablet Commonly known as:   DULCOLAX Take 5 mg by mouth daily as needed for moderate constipation.   CALCIUM-VITAMIN D PO Take 1 tablet by mouth 2 (two) times daily.   cephALEXin 500 MG capsule Commonly known as:  KEFLEX Take 1 capsule (500 mg total) by mouth 2 (two) times daily for 7 days.   CRESTOR 10 MG tablet Generic drug:  rosuvastatin Take 10 mg by mouth daily.   donepezil 10 MG tablet Commonly known as:  ARICEPT Take 10 mg by mouth at bedtime.   feeding supplement (JEVITY 1.2 CAL) Liqd Place 1,000 mLs into feeding tube continuous.   feeding supplement (PRO-STAT SUGAR FREE 64) Liqd Place 30 mLs into feeding tube daily. Start taking on:  04/02/735   folic acid 1 MG tablet Commonly known as:  FOLVITE Take 1 mg by mouth daily.   free water Soln Place 100 mLs into feeding tube every 8 (eight) hours.   lisinopril 5 MG tablet Commonly known as:  PRINIVIL,ZESTRIL Take 5 mg by mouth.   omeprazole 20 MG capsule Commonly known as:  PRILOSEC TAKE 1 CAPSULE DAILY   senna-docusate 8.6-50 MG tablet Commonly known as:  Senokot-S Take 1 tablet by mouth at bedtime.   tamoxifen 20 MG tablet Commonly known as:  NOLVADEX Take 20 mg by mouth daily.   timolol 0.5 % ophthalmic  solution Commonly known as:  BETIMOL Place 1 drop into both eyes daily.   tolterodine 2 MG 24 hr capsule Commonly known as:  DETROL LA Take 2 mg by mouth daily.      LABORATORY STUDIES CBC    Component Value Date/Time   WBC 11.8 (H) 04/30/2017 0514   RBC 4.83 04/30/2017 0514   HGB 14.7 04/30/2017 0514   HCT 46.8 (H) 04/30/2017 0514   PLT 124 (L) 04/30/2017 0514   MCV 96.9 04/30/2017 0514   MCH 30.4 04/30/2017 0514   MCHC 31.4 04/30/2017 0514   RDW 16.0 (H) 04/30/2017 0514   LYMPHSABS 2.3 04/25/2017 1626   MONOABS 0.5 04/25/2017 1626   EOSABS 0.2 04/25/2017 1626   BASOSABS 0.0 04/25/2017 1626   CMP    Component Value Date/Time   NA 157 (H) 04/30/2017 0514   K 3.6 04/30/2017 0514   CL 119 (H) 04/30/2017 0514    CO2 27 04/30/2017 0514   GLUCOSE 157 (H) 04/30/2017 0514   BUN 40 (H) 04/30/2017 0514   CREATININE 0.61 04/30/2017 0514   CREATININE 0.72 01/29/2012 1111   CALCIUM 8.5 (L) 04/30/2017 0514   PROT 6.0 (L) 04/25/2017 1626   ALBUMIN 3.5 04/25/2017 1626   AST 27 04/25/2017 1626   ALT 12 (L) 04/25/2017 1626   ALKPHOS 38 04/25/2017 1626   BILITOT 0.4 04/25/2017 1626   GFRNONAA >60 04/30/2017 0514   GFRAA >60 04/30/2017 0514   COAGS Lab Results  Component Value Date   INR 1.02 04/25/2017   Lipid Panel    Component Value Date/Time   CHOL 228 (H) 01/29/2012 1111   TRIG 122 01/29/2012 1111   HDL 52 01/29/2012 1111   CHOLHDL 4.4 01/29/2012 1111   VLDL 24 01/29/2012 1111   LDLCALC 152 (H) 01/29/2012 1111   HgbA1C No results found for: HGBA1C Urinalysis    Component Value Date/Time   COLORURINE YELLOW 04/28/2017 1812   APPEARANCEUR CLOUDY (A) 04/28/2017 1812   LABSPEC 1.016 04/28/2017 1812   PHURINE 6.0 04/28/2017 1812   GLUCOSEU 50 (A) 04/28/2017 1812   HGBUR SMALL (A) 04/28/2017 1812   BILIRUBINUR NEGATIVE 04/28/2017 1812   BILIRUBINUR neg 01/29/2012 1042   KETONESUR NEGATIVE 04/28/2017 1812   PROTEINUR 30 (A) 04/28/2017 1812   UROBILINOGEN negative 01/29/2012 1042   NITRITE NEGATIVE 04/28/2017 1812   LEUKOCYTESUR LARGE (A) 04/28/2017 1812   Urine Drug Screen No results found for: LABOPIA, COCAINSCRNUR, LABBENZ, AMPHETMU, THCU, LABBARB  Alcohol Level No results found for: [facility]  SIGNIFICANT DIAGNOSTIC STUDIES Ct Head Wo Contrast 04/29/2017 IMPRESSION: 1. Evolving large LEFT frontal intraparenchymal hematoma without rebleed. 2. Similar mass effect with 7 mm LEFT-to-RIGHT midline shift. No ventricular entrapment. 3. Similar to small volume LEFT frontal subarachnoid hemorrhage.  Ct Angio Head W Or Wo Contrast Ct Head Code Stroke Wo Contrast 04/25/2017  IMPRESSION:  1. 7.9 x 3.4 x 5.1 cm acute intraparenchymal hematoma positioned within the anterior left frontal lobe  (estimated volume 68 cc). Associated regional mass effect with trace 3 mm left-to-right shift. Primary differential considerations include hypertensive bleed or possibly amyloid angiopathy.  2. Underlying moderate cerebral atrophy with chronic small vessel ischemic disease.  3. Negative intracranial CTA. No vascular abnormality seen underlying the intraparenchymal hematoma. No large vessel occlusion.  4. Mild for age intracranial atherosclerosis as above. No high-grade or correctable stenosis.   Ct Head Wo Contrast 04/26/2017 IMPRESSION:  1. Increased size of left frontal hematoma 99 cc, previously 68 cc.  2. Increased small  volume subarachnoid hemorrhage over left frontal convexity.  3. Increase edema and mass effect with left-to-right midline shift of 7 mm. Mild left-to-right subfalcine herniation. No downward herniation.   Dg Swallowing Func-speech Pathology 04/27/2017 Recommend continued NPO.  Family has met with Palliative medicine and they would like to pursue Cortrak to allow time for their mother to maximize any functional recovery.        HISTORY OF [place] COURSE ASSESSMENT/PLAN Ms. Matti Killingsworth is a 84 y.o. female with history of hyperlipidemia, hypertension, dementia admitted for aphasia and right-sided weakness. No tPA given due to [place].    ICH:  left frontal large ICH, hypertensive versus cerebral amyloid angiopathy  Resultant RUE plegia, right neglect, global aphasia  CT head large left frontal ICH  Repeat CT head showed mild enlargement of left frontal ICH  CTA head no AVM or aneurysm  Repeat CT 04/29/17 - no significant change  SCDs for VTE prophylaxis  Diet NPO time specified  Fall precautions   aspirin 81 mg daily prior to admission, now on No antithrombotic  due to [place]  Ongoing aggressive stroke risk factor management  Therapy recommendations: PT and OT recommend SNF  Disposition:  [facility]  Cerebral edema  Repeat  CT showed mild enlargement of left frontal ICH  Mild midline shift  Off 3% saline via peripheral  Na 153->154  CT repeat 04/29/17 no significant change  Dysphagia   Speech on board  NPO now  On TF with cortrak  UTI  WBC TNTC  Put on rocephin, started 04/29/2017 changed to Keflex PO x 7 days  Hypertension  Stable  BP goal < 160  Off Cardene drip, on labetalol PRN  Resumed home dose Lisinopril  Long term BP goal normotensive  Hyperlipidemia  Home meds: Crestor  Resumed crestor   Other Stroke Risk Factors  Advanced age  Other Active Problems  Dementia at baseline  Leukocytosis - 13.1->13.5 - Tmax 100  Hypokalemia - 2.8 -> supplement -> 3.3 - continue supplement  DISCHARGE EXAM Blood pressure (!) 153/91, pulse 99, temperature 97.9 F (36.6 C), temperature source Oral, resp. rate 18, height 5' 4"  (1.626 m), weight 67.3 kg (148 lb 5.9 oz), SpO2 95 %. General - Well nourished, well developed, not in acute distress. Ophthalmologic - fundi not visualized due to noncooperation. Cardiovascular - Regular rate and rhythm with no murmur.  Neuro - awake alert, eyes open, able to follow limited commands, little spontaneous speech output, not able to name but able to repeat simple sentences.  Left gaze preference, but able to cross midline, able to track object from left to right, right-sided neglect.  PERRL, blinking to visual threat on the left.  Right facial droop, tongue examination not cooperative.  Left upper extremity 4/5, spontaneous movement.  Bilateral lower extremity withdrawal 2+/5 on pain stimulation.  Right upper extremity flaccid.  DTR 1+, Babinski negative. Sensation, coordination and gait not tested  Discharge Diet   Diet NPO time specified Fall precautions TF's in progress per Cortrak  DISCHARGE PLAN  Disposition:  [facility]  No antithrombotic for secondary stroke prevention.  Ongoing risk factor control by Primary Care Physician at  time of discharge  Follow-up No primary care provider on file. in 2 weeks.  Follow-up with PCP per SNF  Greater than 35 minutes were spent preparing discharge.  Mary Sella, ANP-C Neurology Stroke Team 04/30/2017  11:10 AM I have personally examined this patient, reviewed notes,  independently viewed imaging studies, participated in medical decision making and plan of care.ROS completed by me personally and pertinent positives fully documented  I have made any additions or clarifications directly to the above note. Agree with note above. Discuss with patient's son and answered questions  Antony Contras, MD Medical Director [facility] Stroke [facility] Pager: [phone] 04/30/2017 1:26 PM

## 2017-04-30 NOTE — [place] FL2 (Signed)
[place] MEDICAID FL2 LEVEL OF CARE SCREENING TOOL     IDENTIFICATION  Patient Name: Brianna Gregory Birthdate: [birth year] Sex: female Admission Date (Current Location): 04/25/2017  [facility] and [place] Number:  Herbalist and Address:  [facility], [place] [address] [place], [place] [address]      Provider Number: [id number]  Attending Physician Name and Address:  Rosalin Hawking, MD  Relative Name and Phone Number:       Current Level of Care: Hospital Recommended Level of Care: [place] Prior Approval Number:    Date Approved/Denied:   PASRR Number: [id number]  Discharge Plan: SNF    Current Diagnoses: Patient Active Problem List   Diagnosis Date Noted  . Hypokalemia   . Hypophosphatemia   . Leukocytosis   . Intracerebral hemorrhage 04/27/2017  . Goals of care, counseling/discussion   . Palliative care by specialist   . Hemi-neglect of right side   . Acute intracranial hemorrhage ([place]) 04/25/2017  . Left knee pain 09/30/2014  . Toe pain, left 09/24/2014  . Frequent UTI 04/28/2014  . Mild cognitive disorder 03/06/2014  . BP (high blood pressure) 03/03/2014  . Right knee injury 07/17/2013  . Right ankle pain 06/12/2012  . Right knee pain 06/12/2012  . Labial lesion 06/04/2012  . Paresthesias/numbness 06/04/2012  . Pain in joint, ankle and foot 06/04/2012  . GERD (gastroesophageal reflux disease) 01/29/2012  . Other and unspecified hyperlipidemia 01/29/2012  . Numbness of toes 01/29/2012  . Osteoporosis 01/29/2012  . Back pain 03/14/2011  . History of compression fracture of spine 03/14/2011  . Headache(784.0) 02/01/2011  . Rectal polyp 01/12/2011  . Heel spur 12/22/2010  . Arthritis   . Skin cancer   . Menopause   . Glaucoma   . Lumbar herniated disc   . Urge incontinence   . Diverticulosis     Orientation RESPIRATION BLADDER Height & Weight     (unable to assess; patient aphasic)  Normal  Incontinent Weight: 148 lb 5.9 oz (67.3 kg) Height:  5\' 4"  (162.6 cm)  BEHAVIORAL SYMPTOMS/MOOD NEUROLOGICAL BOWEL NUTRITION STATUS      Incontinent Feeding tube(NG tube)  AMBULATORY STATUS COMMUNICATION OF NEEDS Skin   Extensive Assist Non-Verbally Normal                       Personal Care Assistance Level of Assistance  Bathing, Feeding, Dressing Bathing Assistance: Maximum assistance Feeding assistance: Maximum assistance Dressing Assistance: Maximum assistance     Functional Limitations Info  Sight, Hearing, Speech Sight Info: Adequate Hearing Info: Adequate Speech Info: Impaired(aphasic)    SPECIAL CARE FACTORS FREQUENCY                       Contractures Contractures Info: Not present    Additional Factors Info  Code Status, Allergies Code Status Info: DNR Allergies Info: Statins           Current Medications (04/30/2017):  This is the current hospital active medication list Current Facility-Administered Medications  Medication Dose Route Frequency Provider Last Rate Last Dose  .  stroke: mapping our early stages of recovery book   Does not apply Once Aroor, Sushanth R, MD      . 0.9 %  sodium chloride infusion   Intravenous Continuous Rosalin Hawking, MD 20 mL/hr at 04/27/17 1931    . acetaminophen (TYLENOL) tablet 650 mg  650 mg Oral Q4H PRN Aroor, Lanice Schwab, MD  Or  . acetaminophen (TYLENOL) solution 650 mg  650 mg Per Tube Q4H PRN Aroor, Lanice Schwab, MD       Or  . acetaminophen (TYLENOL) suppository 650 mg  650 mg Rectal Q4H PRN Aroor, Karena Addison R, MD      . cefTRIAXone (ROCEPHIN) 1 g in dextrose 5 % 50 mL IVPB  1 g Intravenous QHS Rosalin Hawking, MD   Stopped at 04/29/17 2324  . feeding supplement (JEVITY 1.2 CAL) liquid 1,000 mL  1,000 mL Per Tube Continuous Rosalin Hawking, MD 50 mL/hr at 04/29/17 1550 1,000 mL at 04/29/17 1550  . feeding supplement (PRO-STAT SUGAR FREE 64) liquid 30 mL  30 mL Per Tube Daily Rosalin Hawking, MD   30 mL at 04/30/17 0954   . free water 100 mL  100 mL Per Tube Q8H Rinehuls, David L, PA-C   100 mL at 04/30/17 0609  . heparin injection 5,000 Units  5,000 Units Subcutaneous Q12H Rosalin Hawking, MD   5,000 Units at 04/30/17 [phone]  . labetalol (NORMODYNE,TRANDATE) injection 10-20 mg  10-20 mg Intravenous Q10 min PRN Rosalin Hawking, MD   20 mg at 04/28/17 1635  . pantoprazole sodium (PROTONIX) 40 mg/20 mL oral suspension 40 mg  40 mg Per Tube Daily Rosalin Hawking, MD   40 mg at 04/30/17 0954  . rosuvastatin (CRESTOR) tablet 10 mg  10 mg Oral Daily Rosalin Hawking, MD   10 mg at 04/30/17 0954  . senna-docusate (Senokot-S) tablet 1 tablet  1 tablet Oral BID Aroor, Lanice Schwab, MD   1 tablet at 04/30/17 8527     Discharge Medications: Please see discharge summary for a list of discharge medications.  Relevant Imaging Results:  Relevant Lab Results:   Additional Information SS#: [id number]  Geralynn Ochs, LCSW

## 2017-05-25 DEATH — deceased

## 2018-08-28 IMAGING — CT CT HEAD W/O CM
4 series · 15 of 47 positions shown, 17 images · non-contrast
Comparison: CT HEAD April 26, 2017 and CT HEAD September 24, 2015

CLINICAL DATA: Follow-up stroke.

EXAM:
CT HEAD WITHOUT CONTRAST
TECHNIQUE: Contiguous axial images were obtained from the base of the skull
through the vertex without intravenous contrast.

[Series 3: head without · axial · non-contrast · 0.42mm/px · z∈[-145,-25]mm · 7 of 33 slices shown, 9 images]
[im 5/33  brain]
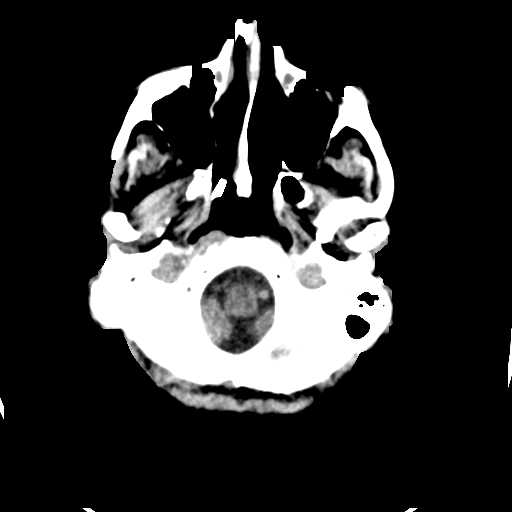
[im 5/33  bone]
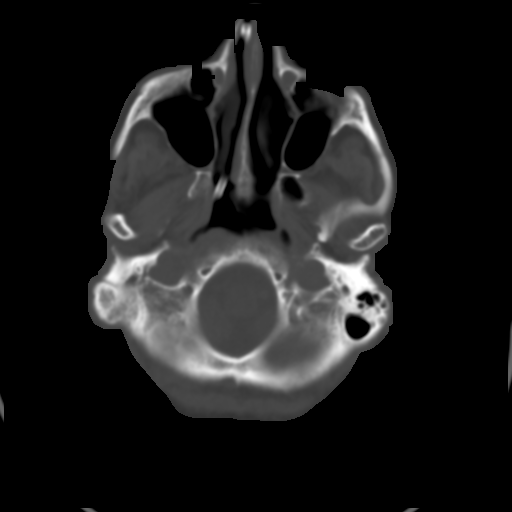
[im 9/33  brain]
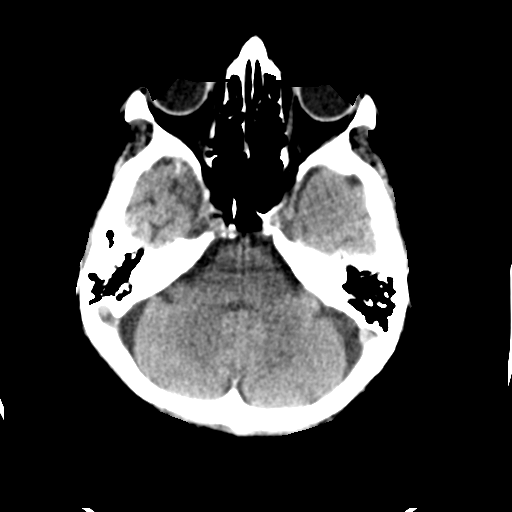
[im 13/33  brain]
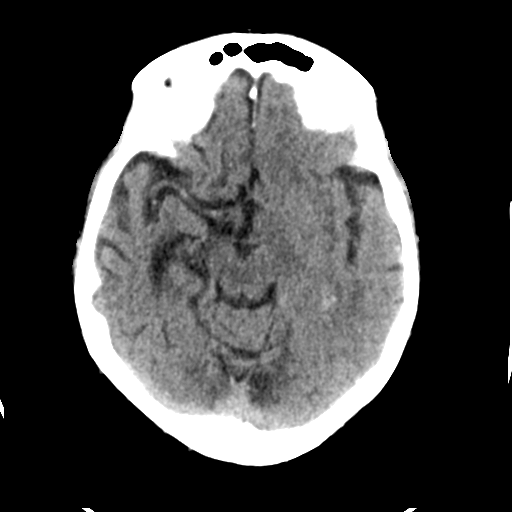
[im 17/33  brain]
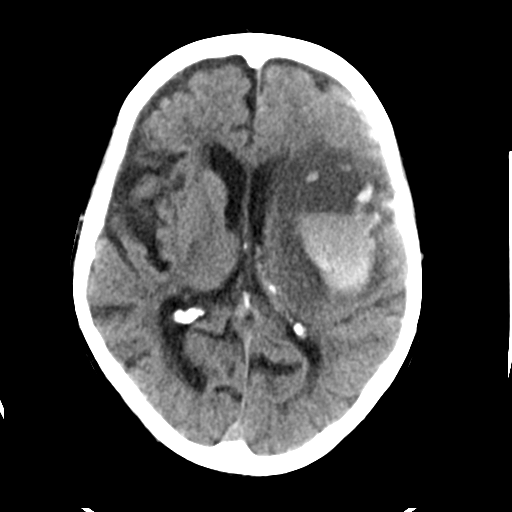
[im 21/33  brain]
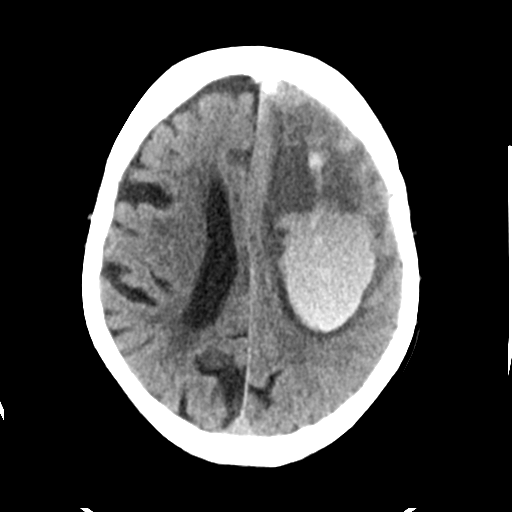
[im 21/33  bone]
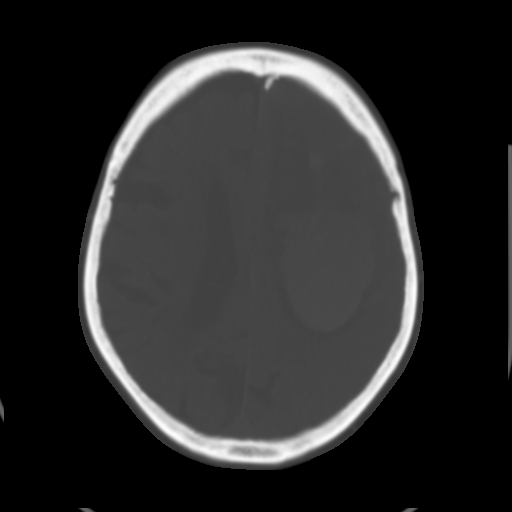
[im 25/33  brain]
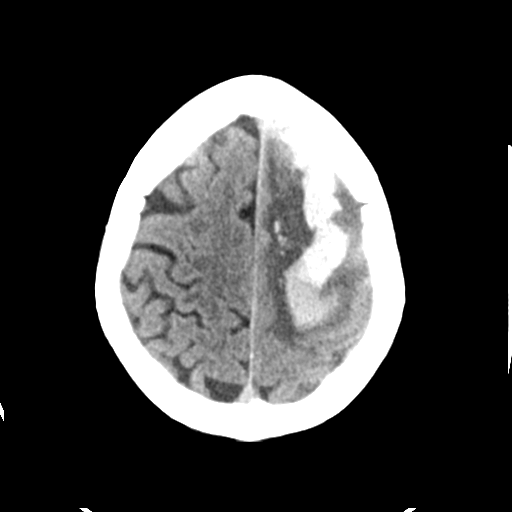
[im 29/33  brain]
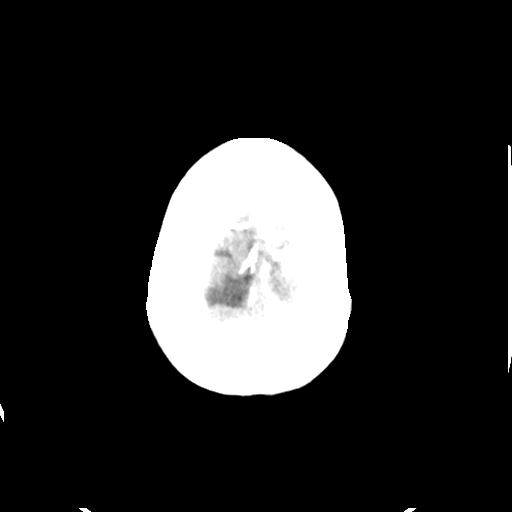

[Series 4: head bone · axial · 0.42mm/px · z∈[-149,-133]mm · 2 of 82 slices shown]
[im 9/82  bone]
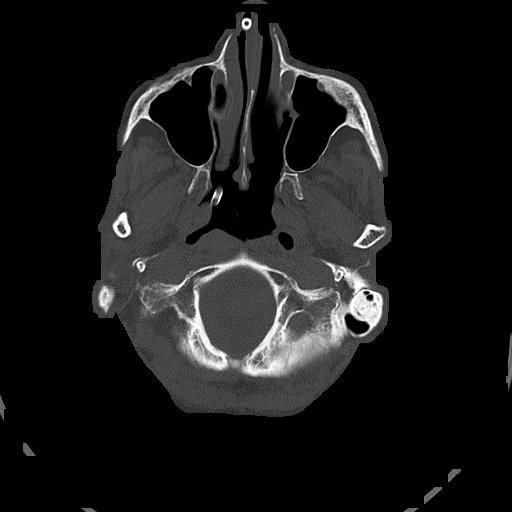
[im 17/82  bone]
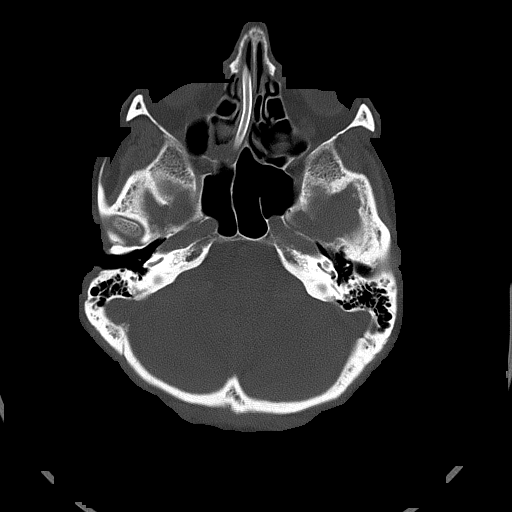

[Series 5: head without cor · coronal · non-contrast · 0.32mm/px · 3 of 69 slices shown]
[im 23/69  brain]
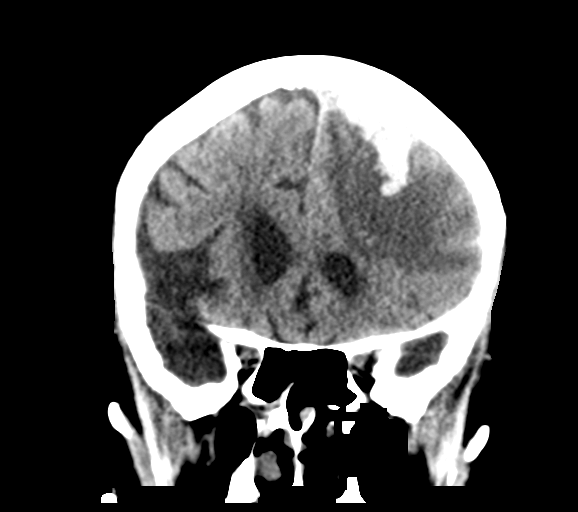
[im 31/69  brain]
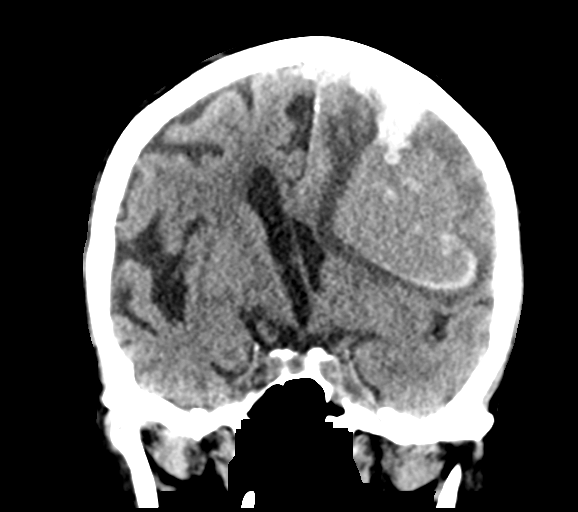
[im 38/69  brain]
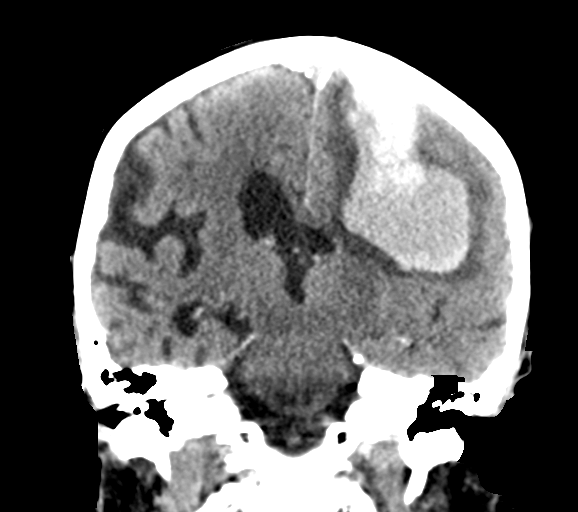

[Series 6: head without sag · sagittal · non-contrast · 0.32mm/px · 3 of 66 slices shown]
[im 22/66  brain]
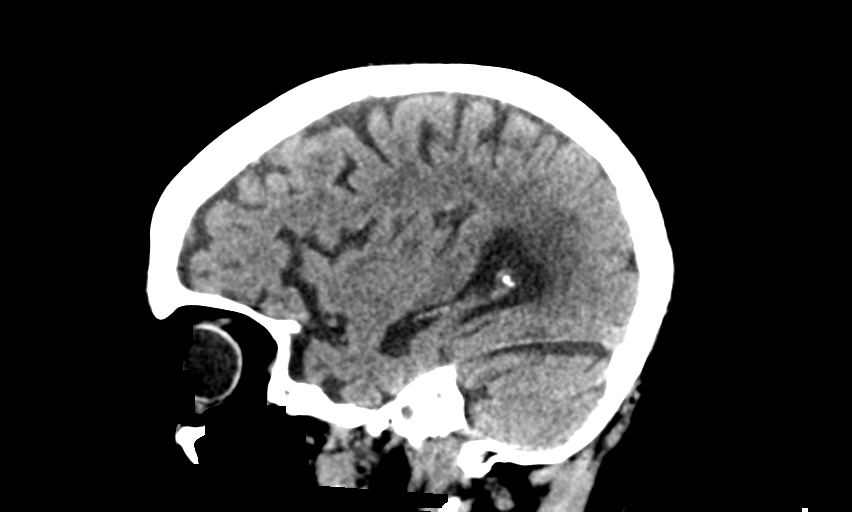
[im 33/66  brain]
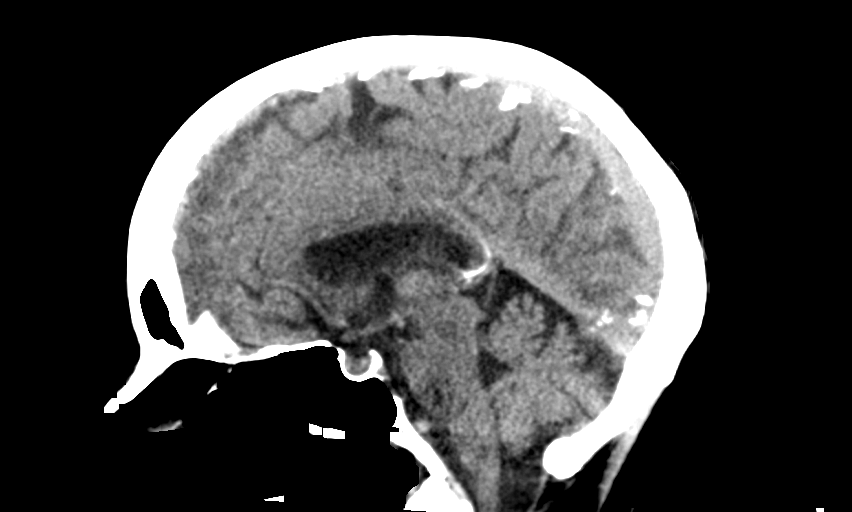
[im 44/66  brain]
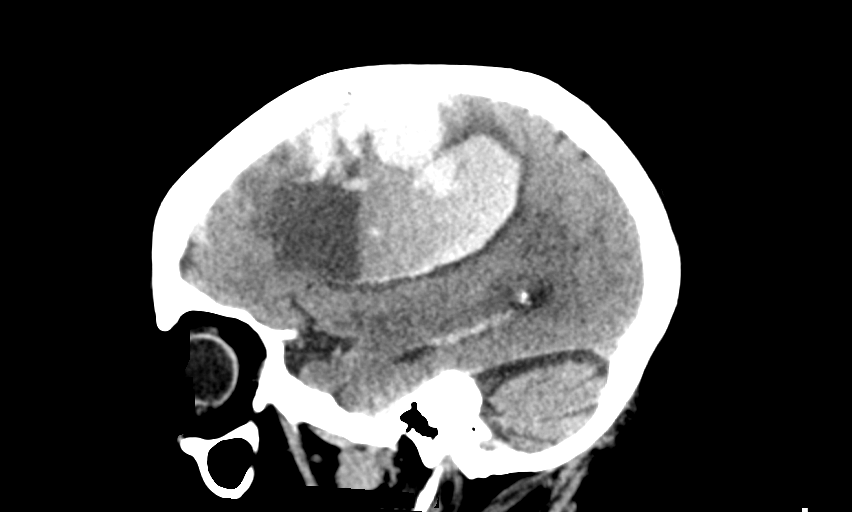

[15 of 47 positions shown; findings below may reference images not displayed]

FINDINGS: BRAIN: 4.1 x 5.4 x 8 cm degenerating LEFT frontal intraparenchymal
hematoma with hematocrit level, no rebleed. Surrounding vasogenic
edema and mass effect resulting in 7 mm LEFT-to-RIGHT midline shift,
similar. Mass effect on LEFT lateral ventricle without RIGHT
ventricular entrapment. No intraventricular extension. No
hydrocephalus. Patchy supratentorial white matter hypodensities
exclusive a aforementioned abnormality most compatible with moderate
chronic small vessel ischemic disease. Small volume LEFT frontal
subarachnoid hemorrhage, stable to decreased from prior imaging.
Basal cisterns are patent.

VASCULAR: Mild calcific atherosclerosis of the carotid siphons.

SKULL: No skull fracture. No significant scalp soft tissue swelling.

SINUSES/ORBITS: RIGHT nasogastric tube. Mild paranasal sinus mucosal
thickening with in small mucosal retention cyst. Minimal RIGHT
mastoid effusion. Status post bilateral ocular lens implants.

OTHER: None.
IMPRESSION: 1. Evolving large LEFT frontal intraparenchymal hematoma without
rebleed.
2. Similar mass effect with 7 mm LEFT-to-RIGHT midline shift. No
ventricular entrapment.
3. Similar to small volume LEFT frontal subarachnoid hemorrhage.
